# Patient Record
Sex: Male | Born: 1950
Health system: Southern US, Community
[De-identification: ages and names within clinical notes are randomized; demographics above are authoritative.]

## PROBLEM LIST (undated history)

## (undated) DIAGNOSIS — I10 Essential (primary) hypertension: Secondary | ICD-10-CM

## (undated) DIAGNOSIS — E119 Type 2 diabetes mellitus without complications: Secondary | ICD-10-CM

## (undated) DIAGNOSIS — E785 Hyperlipidemia, unspecified: Secondary | ICD-10-CM

## (undated) HISTORY — DX: Essential (primary) hypertension: I10

## (undated) HISTORY — PX: KNEE SURGERY: SHX244

## (undated) HISTORY — DX: Type 2 diabetes mellitus without complications: E11.9

## (undated) HISTORY — PX: FOOT SURGERY: SHX648

---

## 2015-07-02 DIAGNOSIS — N529 Male erectile dysfunction, unspecified: Secondary | ICD-10-CM | POA: Diagnosis not present

## 2015-07-02 DIAGNOSIS — I10 Essential (primary) hypertension: Secondary | ICD-10-CM | POA: Diagnosis not present

## 2015-07-02 DIAGNOSIS — B189 Chronic viral hepatitis, unspecified: Secondary | ICD-10-CM | POA: Diagnosis not present

## 2015-07-02 MED FILL — AMLODIPINE BESYLATE 10 MG T: 10 | 30 days supply | Qty: 30 | Fill #0

## 2015-07-02 MED FILL — LISINOPRIL 40 MG TABLET: 40 | 30 days supply | Qty: 30 | Fill #0

## 2015-07-02 MED FILL — ATORVASTATIN 20 MG TABLET: 20 | 30 days supply | Qty: 30 | Fill #0

## 2015-07-02 MED FILL — HYDROCHLOROTHIAZIDE 25 MG T: 25 | 30 days supply | Qty: 30 | Fill #0

## 2015-07-05 MED FILL — metFORMIN HCL 1000 MG TABS: 1000 | 30 days supply | Qty: 60 | Fill #0

## 2015-07-16 DIAGNOSIS — E119 Type 2 diabetes mellitus without complications: Secondary | ICD-10-CM | POA: Diagnosis not present

## 2015-07-16 DIAGNOSIS — I10 Essential (primary) hypertension: Secondary | ICD-10-CM | POA: Diagnosis not present

## 2015-07-22 MED FILL — TRUEplus LANCETS 30G MISC: 90 days supply | Qty: 100 | Fill #0

## 2015-07-22 MED FILL — TRUE METRIX GLUCOSE TEST ST: 90 days supply | Qty: 100 | Fill #0

## 2015-07-26 MED FILL — ATORVASTATIN 20 MG TABLET: 20 | 30 days supply | Qty: 30 | Fill #1

## 2015-08-02 MED FILL — AMLODIPINE BESYLATE 10 MG T: 10 | 30 days supply | Qty: 30 | Fill #1

## 2015-08-02 MED FILL — HYDROCHLOROTHIAZIDE 25 MG T: 25 | 30 days supply | Qty: 30 | Fill #1

## 2015-08-04 MED FILL — metFORMIN HCL 1000 MG TABS: 1000 | 30 days supply | Qty: 60 | Fill #1

## 2015-08-11 DIAGNOSIS — E119 Type 2 diabetes mellitus without complications: Secondary | ICD-10-CM | POA: Diagnosis not present

## 2015-08-24 MED FILL — TRUEplus LANCETS 30G MISC: 50 days supply | Qty: 100 | Fill #0

## 2015-08-24 MED FILL — ATORVASTATIN 20 MG TABLET: 20 | 30 days supply | Qty: 30 | Fill #2

## 2015-08-24 MED FILL — TRUE METRIX GLUCOSE TEST ST: 50 days supply | Qty: 100 | Fill #0

## 2015-08-30 MED FILL — AMLODIPINE BESYLATE 10 MG T: 10 | 30 days supply | Qty: 30 | Fill #2

## 2015-08-30 MED FILL — HYDROCHLOROTHIAZIDE 25 MG T: 25 | 30 days supply | Qty: 30 | Fill #2

## 2015-09-01 DIAGNOSIS — E119 Type 2 diabetes mellitus without complications: Secondary | ICD-10-CM | POA: Diagnosis not present

## 2015-09-20 DIAGNOSIS — H401134 Primary open-angle glaucoma, bilateral, indeterminate stage: Secondary | ICD-10-CM | POA: Diagnosis not present

## 2015-09-20 MED FILL — LISINOPRIL 40 MG TABLET: 40 | 30 days supply | Qty: 30 | Fill #1

## 2015-09-23 DIAGNOSIS — H10023 Other mucopurulent conjunctivitis, bilateral: Secondary | ICD-10-CM | POA: Diagnosis not present

## 2015-09-24 MED FILL — TOBRAMYCIN 0.3% EYE DROPS: 0.3 | 12 days supply | Qty: 5 | Fill #0

## 2015-09-27 MED FILL — AMLODIPINE BESYLATE 10 MG T: 10 | 30 days supply | Qty: 30 | Fill #3

## 2015-09-27 MED FILL — metFORMIN HCL 1000 MG TABS: 1000 | 30 days supply | Qty: 60 | Fill #2

## 2015-09-27 MED FILL — ATORVASTATIN 20 MG TABLET: 20 | 30 days supply | Qty: 30 | Fill #3

## 2015-10-15 MED FILL — HYDROCHLOROTHIAZIDE 25 MG T: 25 | 30 days supply | Qty: 30 | Fill #3

## 2015-11-09 MED FILL — AMLODIPINE BESYLATE 10 MG T: 10 | 30 days supply | Qty: 30 | Fill #4

## 2015-11-09 MED FILL — ATORVASTATIN 20 MG TABLET: 20 | 30 days supply | Qty: 30 | Fill #4

## 2015-11-09 MED FILL — LISINOPRIL 40 MG TABLET: 40 | 30 days supply | Qty: 30 | Fill #2

## 2015-11-22 MED FILL — HYDROCHLOROTHIAZIDE 25 MG T: 25 | 30 days supply | Qty: 30 | Fill #4

## 2015-12-14 MED FILL — LISINOPRIL 40 MG TABLET: 40 | 30 days supply | Qty: 30 | Fill #3

## 2015-12-14 MED FILL — AMLODIPINE BESYLATE 10 MG T: 10 | 30 days supply | Qty: 30 | Fill #5

## 2015-12-14 MED FILL — ATORVASTATIN 20 MG TABLET: 20 | 30 days supply | Qty: 30 | Fill #5

## 2015-12-20 DIAGNOSIS — E7800 Pure hypercholesterolemia, unspecified: Secondary | ICD-10-CM | POA: Diagnosis not present

## 2015-12-20 DIAGNOSIS — E119 Type 2 diabetes mellitus without complications: Secondary | ICD-10-CM | POA: Diagnosis not present

## 2015-12-20 DIAGNOSIS — R413 Other amnesia: Secondary | ICD-10-CM | POA: Diagnosis not present

## 2015-12-20 DIAGNOSIS — I10 Essential (primary) hypertension: Secondary | ICD-10-CM | POA: Diagnosis not present

## 2015-12-20 MED FILL — VIAGRA 100 MG TABLET: 100 | 30 days supply | Qty: 6 | Fill #0

## 2015-12-20 MED FILL — metFORMIN HCL 1000 MG TABS: 1000 | 30 days supply | Qty: 60 | Fill #0

## 2015-12-20 MED FILL — TRUEplus LANCETS 30G MISC: 50 days supply | Qty: 100 | Fill #0

## 2015-12-20 MED FILL — HYDROCHLOROTHIAZIDE 25 MG T: 25 | 30 days supply | Qty: 30 | Fill #0

## 2015-12-20 MED FILL — TRUE METRIX GLUCOSE TEST ST: 50 days supply | Qty: 100 | Fill #0

## 2015-12-30 DIAGNOSIS — Z23 Encounter for immunization: Secondary | ICD-10-CM | POA: Diagnosis not present

## 2016-01-17 MED FILL — LISINOPRIL 40 MG TABLET: 40 | 30 days supply | Qty: 30 | Fill #4

## 2016-01-17 MED FILL — ATORVASTATIN 20 MG TABLET: 20 | 90 days supply | Qty: 90 | Fill #0

## 2016-01-17 MED FILL — AMLODIPINE BESYLATE 10 MG T: 10 | 90 days supply | Qty: 90 | Fill #0

## 2016-02-15 MED FILL — HYDROCHLOROTHIAZIDE 25 MG T: 25 | 90 days supply | Qty: 90 | Fill #1

## 2016-02-16 MED FILL — LISINOPRIL 40 MG TABLET: 40 | 30 days supply | Qty: 30 | Fill #5

## 2016-02-16 MED FILL — metFORMIN HCL 1000 MG TABS: 1000 | 30 days supply | Qty: 60 | Fill #1

## 2016-03-22 MED FILL — TRUE METRIX GLUCOSE TEST ST: 50 days supply | Qty: 100 | Fill #1

## 2016-03-27 MED FILL — LISINOPRIL 40 MG TABLET: 40 | 30 days supply | Qty: 30 | Fill #0

## 2016-04-17 DIAGNOSIS — E119 Type 2 diabetes mellitus without complications: Secondary | ICD-10-CM | POA: Diagnosis not present

## 2016-04-17 DIAGNOSIS — I10 Essential (primary) hypertension: Secondary | ICD-10-CM | POA: Diagnosis not present

## 2016-04-18 MED FILL — metFORMIN HCL 1000 MG TABS: 1000 | 30 days supply | Qty: 60 | Fill #2

## 2016-04-18 MED FILL — SILDENAFIL 100 MG TABLET: 100 | 30 days supply | Qty: 6 | Fill #1

## 2016-05-12 MED FILL — SILDENAFIL 100 MG TABLET: 100 | 30 days supply | Qty: 6 | Fill #2

## 2016-05-16 MED FILL — LISINOPRIL 40 MG TABLET: 40 | 30 days supply | Qty: 30 | Fill #1

## 2016-05-16 MED FILL — ATORVASTATIN 20 MG TABLET: 20 | 90 days supply | Qty: 90 | Fill #1

## 2016-05-16 MED FILL — HYDROCHLOROTHIAZIDE 25 MG T: 25 | 60 days supply | Qty: 60 | Fill #2

## 2016-05-16 MED FILL — AMLODIPINE BESYLATE 10 MG T: 10 | 90 days supply | Qty: 90 | Fill #1

## 2016-06-13 MED FILL — LISINOPRIL 40 MG TABLET: 40 | 90 days supply | Qty: 90 | Fill #2

## 2016-06-21 MED FILL — FREESTYLE LANCETS: 90 days supply | Qty: 100 | Fill #0

## 2016-06-21 MED FILL — FREESTYLE LITE TEST STRIP: 90 days supply | Qty: 100 | Fill #0

## 2016-06-21 MED FILL — FREESTYLE LITE METER: 1 days supply | Qty: 1 | Fill #0

## 2016-06-21 MED FILL — metFORMIN HCL 1000 MG TABS: 1000 | 30 days supply | Qty: 60 | Fill #0

## 2016-07-25 DIAGNOSIS — E119 Type 2 diabetes mellitus without complications: Secondary | ICD-10-CM | POA: Diagnosis not present

## 2016-07-25 DIAGNOSIS — I10 Essential (primary) hypertension: Secondary | ICD-10-CM | POA: Diagnosis not present

## 2016-07-25 DIAGNOSIS — E7800 Pure hypercholesterolemia, unspecified: Secondary | ICD-10-CM | POA: Diagnosis not present

## 2016-08-22 MED FILL — HYDROCHLOROTHIAZIDE 25 MG T: 25 | 90 days supply | Qty: 90 | Fill #0

## 2016-08-22 MED FILL — metFORMIN HCL 1000 MG TABS: 1000 | 30 days supply | Qty: 60 | Fill #1

## 2016-09-04 MED FILL — ATORVASTATIN 20 MG TABLET: 20 | 90 days supply | Qty: 90 | Fill #0

## 2016-09-04 MED FILL — AMLODIPINE BESYLATE 10 MG T: 10 | 90 days supply | Qty: 90 | Fill #0

## 2016-09-08 MED FILL — SILDENAFIL 100 MG TABLET: 100 | 30 days supply | Qty: 6 | Fill #3

## 2016-10-09 MED FILL — LISINOPRIL 40 MG TAB: 40 | 30 days supply | Qty: 30 | Fill #3

## 2016-10-09 MED FILL — SILDENAFIL CITRATE 100 MG T: 100 | 30 days supply | Qty: 6 | Fill #4

## 2016-10-12 MED FILL — FREESTYLE LITE TEST STRIP: 90 days supply | Qty: 100 | Fill #1

## 2016-11-07 MED FILL — metFORMIN HCL 1000 MG TABS: 1000 | 30 days supply | Qty: 60 | Fill #2

## 2016-11-09 MED FILL — LISINOPRIL 40 MG TAB: 40 | 30 days supply | Qty: 30 | Fill #0

## 2016-11-16 DIAGNOSIS — R413 Other amnesia: Secondary | ICD-10-CM | POA: Diagnosis not present

## 2016-11-16 DIAGNOSIS — I10 Essential (primary) hypertension: Secondary | ICD-10-CM | POA: Diagnosis not present

## 2016-11-16 DIAGNOSIS — E7800 Pure hypercholesterolemia, unspecified: Secondary | ICD-10-CM | POA: Diagnosis not present

## 2016-11-16 DIAGNOSIS — E119 Type 2 diabetes mellitus without complications: Secondary | ICD-10-CM | POA: Diagnosis not present

## 2016-12-05 MED FILL — SILDENAFIL CITRATE 100 MG T: 100 | 30 days supply | Qty: 6 | Fill #5

## 2016-12-05 MED FILL — HYDROCHLOROTHIAZIDE 25 MG T: 25 | 90 days supply | Qty: 90 | Fill #1

## 2016-12-22 MED FILL — ATORVASTATIN 20 MG TABLET: 20 | 90 days supply | Qty: 90 | Fill #1

## 2016-12-22 MED FILL — AMLODIPINE BESYLATE 10 MG T: 10 | 90 days supply | Qty: 90 | Fill #1

## 2016-12-25 MED FILL — metFORMIN HCL 1000 MG TABS: 1000 | 30 days supply | Qty: 60 | Fill #0

## 2017-01-10 MED FILL — LISINOPRIL 40 MG TABLET: 40 | 30 days supply | Qty: 30 | Fill #1

## 2017-01-10 MED FILL — SILDENAFIL CITRATE 100 MG T: 100 | 30 days supply | Qty: 6 | Fill #0 | Status: TO

## 2017-01-16 DIAGNOSIS — H524 Presbyopia: Secondary | ICD-10-CM | POA: Diagnosis not present

## 2017-01-16 DIAGNOSIS — H40023 Open angle with borderline findings, high risk, bilateral: Secondary | ICD-10-CM | POA: Diagnosis not present

## 2017-01-16 DIAGNOSIS — H52213 Irregular astigmatism, bilateral: Secondary | ICD-10-CM | POA: Diagnosis not present

## 2017-01-16 DIAGNOSIS — H348322 Tributary (branch) retinal vein occlusion, left eye, stable: Secondary | ICD-10-CM | POA: Diagnosis not present

## 2017-01-16 DIAGNOSIS — E119 Type 2 diabetes mellitus without complications: Secondary | ICD-10-CM | POA: Diagnosis not present

## 2017-01-16 DIAGNOSIS — H5212 Myopia, left eye: Secondary | ICD-10-CM | POA: Diagnosis not present

## 2017-01-30 DIAGNOSIS — H401133 Primary open-angle glaucoma, bilateral, severe stage: Secondary | ICD-10-CM | POA: Diagnosis not present

## 2017-01-30 DIAGNOSIS — H348322 Tributary (branch) retinal vein occlusion, left eye, stable: Secondary | ICD-10-CM | POA: Diagnosis not present

## 2017-01-30 DIAGNOSIS — H3562 Retinal hemorrhage, left eye: Secondary | ICD-10-CM | POA: Diagnosis not present

## 2017-01-30 DIAGNOSIS — H2513 Age-related nuclear cataract, bilateral: Secondary | ICD-10-CM | POA: Diagnosis not present

## 2017-01-30 MED FILL — LATANOPROST 0.005% EYE DRP: 0.005 | 25 days supply | Qty: 3 | Fill #0

## 2017-02-02 DIAGNOSIS — H401123 Primary open-angle glaucoma, left eye, severe stage: Secondary | ICD-10-CM | POA: Diagnosis not present

## 2017-02-02 DIAGNOSIS — H25813 Combined forms of age-related cataract, bilateral: Secondary | ICD-10-CM | POA: Diagnosis not present

## 2017-02-02 DIAGNOSIS — E119 Type 2 diabetes mellitus without complications: Secondary | ICD-10-CM | POA: Diagnosis not present

## 2017-02-02 DIAGNOSIS — H401111 Primary open-angle glaucoma, right eye, mild stage: Secondary | ICD-10-CM | POA: Diagnosis not present

## 2017-02-02 MED FILL — DORZOLAMIDE-TIMOLOL EYE DRP: 22.3-6.8 | 90 days supply | Qty: 10 | Fill #0

## 2017-02-07 MED FILL — SILDENAFIL CITRATE 100 MG T: 100 | 30 days supply | Qty: 6 | Fill #1 | Status: TO

## 2017-02-07 MED FILL — LISINOPRIL 40 MG TABLET: 40 | 30 days supply | Qty: 30 | Fill #2

## 2017-02-16 MED FILL — FREESTYLE LITE TEST STRIP: 90 days supply | Qty: 100 | Fill #2

## 2017-03-05 MED FILL — SILDENAFIL CITRATE 100 MG T: 100 | 30 days supply | Qty: 6 | Fill #2 | Status: TO

## 2017-03-06 MED FILL — SILDENAFIL CITRATE 100 MG T: 100 | 30 days supply | Qty: 6 | Fill #3 | Status: TO

## 2017-03-23 MED FILL — metFORMIN HCL 1000 MG TABS: 1000 | 30 days supply | Qty: 60 | Fill #1

## 2017-03-23 MED FILL — LISINOPRIL 40 MG TABLET: 40 | 30 days supply | Qty: 30 | Fill #3

## 2017-03-26 DIAGNOSIS — H401123 Primary open-angle glaucoma, left eye, severe stage: Secondary | ICD-10-CM | POA: Diagnosis not present

## 2017-03-26 DIAGNOSIS — H25813 Combined forms of age-related cataract, bilateral: Secondary | ICD-10-CM | POA: Diagnosis not present

## 2017-03-26 DIAGNOSIS — H401111 Primary open-angle glaucoma, right eye, mild stage: Secondary | ICD-10-CM | POA: Diagnosis not present

## 2017-03-30 ENCOUNTER — Ambulatory Visit: Payer: No Typology Code available for payment source | Admitting: Family Medicine

## 2017-04-06 ENCOUNTER — Ambulatory Visit: Payer: No Typology Code available for payment source | Admitting: Family Medicine

## 2017-04-18 MED FILL — SILDENAFIL CITRATE 100 MG T: 100 | 30 days supply | Qty: 6 | Fill #4 | Status: TO

## 2017-05-11 ENCOUNTER — Ambulatory Visit: Payer: No Typology Code available for payment source | Admitting: Family Medicine

## 2017-05-17 ENCOUNTER — Encounter: Payer: Self-pay | Admitting: Family Medicine

## 2017-05-17 ENCOUNTER — Ambulatory Visit (INDEPENDENT_AMBULATORY_CARE_PROVIDER_SITE_OTHER): Payer: No Typology Code available for payment source | Admitting: Family Medicine

## 2017-05-17 VITALS — BP 136/84 | HR 83 | Temp 98.2°F | Ht 71.0 in | Wt 264.6 lb

## 2017-05-17 DIAGNOSIS — I10 Essential (primary) hypertension: Secondary | ICD-10-CM

## 2017-05-17 DIAGNOSIS — H409 Unspecified glaucoma: Secondary | ICD-10-CM | POA: Diagnosis not present

## 2017-05-17 DIAGNOSIS — E119 Type 2 diabetes mellitus without complications: Secondary | ICD-10-CM | POA: Insufficient documentation

## 2017-05-17 DIAGNOSIS — I152 Hypertension secondary to endocrine disorders: Secondary | ICD-10-CM

## 2017-05-17 DIAGNOSIS — Z23 Encounter for immunization: Secondary | ICD-10-CM | POA: Diagnosis not present

## 2017-05-17 DIAGNOSIS — E785 Hyperlipidemia, unspecified: Secondary | ICD-10-CM

## 2017-05-17 DIAGNOSIS — E1159 Type 2 diabetes mellitus with other circulatory complications: Secondary | ICD-10-CM | POA: Diagnosis not present

## 2017-05-17 DIAGNOSIS — N529 Male erectile dysfunction, unspecified: Secondary | ICD-10-CM

## 2017-05-17 DIAGNOSIS — E1169 Type 2 diabetes mellitus with other specified complication: Secondary | ICD-10-CM | POA: Diagnosis not present

## 2017-05-17 DIAGNOSIS — E1139 Type 2 diabetes mellitus with other diabetic ophthalmic complication: Secondary | ICD-10-CM | POA: Diagnosis not present

## 2017-05-17 LAB — POCT GLYCOSYLATED HEMOGLOBIN (HGB A1C): Hemoglobin A1C: 6.7

## 2017-05-17 MED ORDER — ATORVASTATIN CALCIUM 20 MG PO TABS: 20.0000 mg | ORAL_TABLET | Freq: Every day | ORAL | 3 refills | Status: DC

## 2017-05-17 MED ORDER — HYDROCHLOROTHIAZIDE 25 MG PO TABS: 25.0000 mg | ORAL_TABLET | Freq: Every day | ORAL | 3 refills | Status: DC

## 2017-05-17 MED ORDER — METFORMIN HCL ER 500 MG PO TB24: 1000.0000 mg | ORAL_TABLET | Freq: Every day | ORAL | 3 refills | Status: DC

## 2017-05-17 MED ORDER — AMLODIPINE BESYLATE 10 MG PO TABS: 10.0000 mg | ORAL_TABLET | Freq: Every day | ORAL | 3 refills | Status: DC

## 2017-05-17 MED ORDER — SILDENAFIL CITRATE 100 MG PO TABS: 100.0000 mg | ORAL_TABLET | Freq: Every day | ORAL | 3 refills | Status: DC | PRN

## 2017-05-17 MED FILL — HYDROCHLOROTHIAZIDE 25 MG T: 25 | 90 days supply | Qty: 90 | Fill #0

## 2017-05-17 MED FILL — AMLODIPINE BESYLATE 10 MG T: 10 | 90 days supply | Qty: 90 | Fill #0

## 2017-05-17 MED FILL — ATORVASTATIN 20 MG TABLET: 20 | 90 days supply | Qty: 90 | Fill #0

## 2017-05-17 MED FILL — METFORMIN HCL ER 500 MG TAB: 500 | 90 days supply | Qty: 180 | Fill #0

## 2017-05-17 MED FILL — SILDENAFIL CITRATE 100 MG T: 100 | 30 days supply | Qty: 6 | Fill #5 | Status: TO

## 2017-05-17 NOTE — Assessment & Plan Note (Signed)
Continue Lipitor 20 mg daily.  Obtain records from previous PCP.  Will need lipid panel if not done within the last year.

## 2017-05-17 NOTE — Assessment & Plan Note (Signed)
Stable.  Referral to ophthalmology placed.

## 2017-05-17 NOTE — Assessment & Plan Note (Signed)
At goal today.  Continue amlodipine, HCTZ, and lisinopril.  Obtain records from previous PCP.  Follow-up in 6 months.

## 2017-05-17 NOTE — Progress Notes (Signed)
Subjective:  Charles Reid is a 67 y.o. male who presents today with a chief complaint of T2DM and to establish care.   HPI:  Type 2 diabetes, new problem Several year history.  Currently on metformin 1000 mg daily.  While this medication.  Occasionally has diarrhea.  Checks his blood sugar routinely.  Typically in the low 100s.  No polyuria or polydipsia.  No hypoglycemic symptoms.  Reports A1c of 7.2 about 6 months ago.  Hypertension, new problem Several year history.  Current medications include Norvasc 10 mg daily, HCTZ 25 mg daily, and lisinopril 40 mg daily.  Tolerates all his medications well without side effects.  No chest pain or shortness of breath.  No lower extremity swelling.  Hyperlipidemia, new problem Currently on atorvastatin 20 mg daily.  He tolerates this medication well without side effects.  No myalgias.  Erectile dysfunction, new problem Several year history.  Uses sildenafil 100 mg tablets as needed.  No priapism.  No dizziness or lightheadedness.  Glaucoma, new problem Several year history.  Sees ophthalmology.  Needs a referral to see them soon.  Vision is stable.  No eye pain.  ROS: Per HPI, otherwise a complete review of systems was negative.   PMH:  The following were reviewed and entered/updated in epic: Past Medical History:  Diagnosis Date  . Diabetes mellitus without complication (HCC)   . Hypertension    Patient Active Problem List   Diagnosis Date Noted  . Hypertension associated with diabetes (HCC) 05/17/2017  . Diabetes mellitus (HCC) 05/17/2017  . Hyperlipidemia associated with type 2 diabetes mellitus (HCC) 05/17/2017  . Glaucoma 05/17/2017  . Erectile dysfunction 05/17/2017   Past Surgical History:  Procedure Laterality Date  . FOOT SURGERY    . KNEE SURGERY      FH of heart disease in parents.   Medications- reviewed and updated Current Outpatient Medications  Medication Sig Dispense Refill  . amLODipine (NORVASC) 10 MG  tablet Take 1 tablet (10 mg total) by mouth daily. 90 tablet 3  . aspirin EC 81 MG tablet Take 81 mg by mouth daily.    Marland Kitchen atorvastatin (LIPITOR) 20 MG tablet Take 1 tablet (20 mg total) by mouth daily. 90 tablet 3  . b complex vitamins tablet Take 1 tablet by mouth daily.    . dorzolamide-timolol (COSOPT) 22.3-6.8 MG/ML ophthalmic solution Apply to eye.    Marland Kitchen FREESTYLE LITE test strip USE ONCE A DAY AS DIRECTED  5  . hydrochlorothiazide (HYDRODIURIL) 25 MG tablet Take 1 tablet (25 mg total) by mouth daily. 90 tablet 3  . latanoprost (XALATAN) 0.005 % ophthalmic solution Apply to eye.    Marland Kitchen lisinopril (PRINIVIL,ZESTRIL) 40 MG tablet Take 40 mg by mouth daily.    . Multiple Vitamin (MULTI-VITAMINS) TABS Take by mouth.    . sildenafil (VIAGRA) 100 MG tablet Take 1 tablet (100 mg total) by mouth daily as needed for erectile dysfunction. 30 tablet 3  . metFORMIN (GLUCOPHAGE XR) 500 MG 24 hr tablet Take 2 tablets (1,000 mg total) by mouth daily with breakfast. 180 tablet 3   No current facility-administered medications for this visit.    Allergies-reviewed and updated No Known Allergies  Social History   Socioeconomic History  . Marital status: Unknown    Spouse name: None  . Number of children: None  . Years of education: None  . Highest education level: None  Social Needs  . Financial resource strain: None  . Food insecurity - worry: None  .  Food insecurity - inability: None  . Transportation needs - medical: None  . Transportation needs - non-medical: None  Occupational History  . None  Tobacco Use  . Smoking status: Never Smoker  . Smokeless tobacco: Never Used  Substance and Sexual Activity  . Alcohol use: Yes    Comment: Occasionally  . Drug use: No  . Sexual activity: Yes  Other Topics Concern  . None  Social History Narrative  . None   Objective:  Physical Exam: BP 136/84 (BP Location: Left Arm, Patient Position: Sitting, Cuff Size: Large)   Pulse 83   Temp 98.2 F  (36.8 C) (Oral)   Ht 5\' 11"  (1.803 m)   Wt 264 lb 9.6 oz (120 kg)   SpO2 93%   BMI 36.90 kg/m   Gen: NAD, resting comfortably CV: RRR with no murmurs appreciated Pulm: NWOB, CTAB with no crackles, wheezes, or rhonchi GI: Normal bowel sounds present. Soft, Nontender, Nondistended. MSK: No edema, cyanosis, or clubbing noted Skin: Warm, dry Neuro: Grossly normal, moves all extremities Psych: Normal affect and thought content  Results for orders placed or performed in visit on 05/17/17 (from the past 24 hour(s))  POCT glycosylated hemoglobin (Hb A1C)     Status: None   Collection Time: 05/17/17 10:05 AM  Result Value Ref Range   Hemoglobin A1C 6.7      Assessment/Plan:  Diabetes mellitus (HCC) A1c improved to 6.7 today.  We will change his metformin to the extended release form to see if this helps him with his diarrhea.  Continue 1000 mg daily.  He will follow-up with me in 6 months.  We will obtain records from previous PCP regarding preventative healthcare.  Hypertension associated with diabetes (HCC) At goal today.  Continue amlodipine, HCTZ, and lisinopril.  Obtain records from previous PCP.  Follow-up in 6 months.  Hyperlipidemia associated with type 2 diabetes mellitus (HCC) Continue Lipitor 20 mg daily.  Obtain records from previous PCP.  Will need lipid panel if not done within the last year.  Glaucoma Stable.  Referral to ophthalmology placed.  Erectile dysfunction Stable.  Viagra refilled.  Preventative healthcare Flu shot given today.  Obtain records from previous PCP.  Katina Degree. Jimmey Konstantinos, MD 05/17/2017 10:24 AM

## 2017-05-17 NOTE — Patient Instructions (Signed)
It was very nice to meet you today.  Your A1c is 6.7 I will send in the extended release form of metformin. Please take 2 pills every morning.  I will refill your other meds.  I put in a referral to the eye doctor for you.  Come back to see me in 6 months, or sooner as needed.  Take care, Dr Jimmey Ramona

## 2017-05-17 NOTE — Assessment & Plan Note (Signed)
A1c improved to 6.7 today.  We will change his metformin to the extended release form to see if this helps him with his diarrhea.  Continue 1000 mg daily.  He will follow-up with me in 6 months.  We will obtain records from previous PCP regarding preventative healthcare.

## 2017-05-17 NOTE — Assessment & Plan Note (Signed)
Stable.  Viagra refilled. 

## 2017-05-23 ENCOUNTER — Encounter: Payer: Self-pay | Admitting: *Deleted

## 2017-05-23 ENCOUNTER — Ambulatory Visit: Payer: Self-pay | Admitting: *Deleted

## 2017-05-23 NOTE — Telephone Encounter (Signed)
This encounter was created in error - please disregard.

## 2017-05-23 NOTE — Telephone Encounter (Signed)
See note

## 2017-05-23 NOTE — Telephone Encounter (Signed)
Pt  Has  Been taking  Nyquil   And  Drinking lots  Of  Liquids    The  Cough  Is  Persistent   He  Reports  His  Wife  His  Similar  Symptoms.    The  Patient   Denies  Any  Shortness  Of  Breath   He  Reports some    Sinus  Congestion    As   Well   He  Agrees   To plan of  Care.        Reason for Disposition . [1] Continuous (nonstop) coughing interferes with work or school AND [2] no improvement using cough treatment per Care Advice  Answer Assessment - Initial Assessment Questions 1. ONSET: "When did the cough begin?"       Day  And  1/2  Ago   2. SEVERITY: "How bad is the cough today?"         Bad     Coughing  Very  Frequent    3. RESPIRATORY DISTRESS: "Describe your breathing."        Breathing  Ok   4. FEVER: "Do you have a fever?" If so, ask: "What is your temperature, how was it measured, and when did it start?"        99.3    Oral  Thermometer   1/2  Hour   ago 5. SPUTUM: "Describe the color of your sputum" (clear, white, yellow, green)     White   6. HEMOPTYSIS: "Are you coughing up any blood?" If so ask: "How much?" (flecks, streaks, tablespoons, etc.)     no 7. CARDIAC HISTORY: "Do you have any history of heart disease?" (e.g., heart attack, congestive heart failure)       no 8. LUNG HISTORY: "Do you have any history of lung disease?"  (e.g., pulmonary embolus, asthma, emphysema)     no 9. PE RISK FACTORS: "Do you have a history of blood clots?" (or: recent major surgery, recent prolonged travel, bedridden )       no 10. OTHER SYMPTOMS: "Do you have any other symptoms?" (e.g., runny nose, wheezing, chest pain)        Slightly  Runny   Head congested      11. PREGNANCY: "Is there any chance you are pregnant?" "When was your last menstrual period?"       n/a 12. TRAVEL: "Have you traveled out of the country in the last month?" (e.g., travel history, exposures)         Wife  Has  Similar  Symptoms  -  X 3  Days  Protocols used: COUGH - ACUTE PRODUCTIVE-A-AH

## 2017-05-24 ENCOUNTER — Encounter: Payer: Self-pay | Admitting: Family Medicine

## 2017-05-24 ENCOUNTER — Ambulatory Visit (INDEPENDENT_AMBULATORY_CARE_PROVIDER_SITE_OTHER): Payer: No Typology Code available for payment source | Admitting: Family Medicine

## 2017-05-24 VITALS — BP 146/86 | HR 101 | Temp 98.6°F | Ht 71.0 in | Wt 260.2 lb

## 2017-05-24 DIAGNOSIS — R509 Fever, unspecified: Secondary | ICD-10-CM | POA: Diagnosis not present

## 2017-05-24 DIAGNOSIS — J101 Influenza due to other identified influenza virus with other respiratory manifestations: Secondary | ICD-10-CM | POA: Diagnosis not present

## 2017-05-24 DIAGNOSIS — R52 Pain, unspecified: Secondary | ICD-10-CM | POA: Diagnosis not present

## 2017-05-24 DIAGNOSIS — R030 Elevated blood-pressure reading, without diagnosis of hypertension: Secondary | ICD-10-CM | POA: Diagnosis not present

## 2017-05-24 DIAGNOSIS — R059 Cough, unspecified: Secondary | ICD-10-CM

## 2017-05-24 DIAGNOSIS — R05 Cough: Secondary | ICD-10-CM

## 2017-05-24 LAB — POC INFLUENZA A&B (BINAX/QUICKVUE)
INFLUENZA A, POC: POSITIVE — AB
INFLUENZA B, POC: NEGATIVE

## 2017-05-24 MED ORDER — GUAIFENESIN-CODEINE 100-10 MG/5ML PO SOLN: 5.0000 mL | Freq: Three times a day (TID) | ORAL | 0 refills | Status: DC | PRN

## 2017-05-24 MED ORDER — OSELTAMIVIR PHOSPHATE 75 MG PO CAPS: 75.0000 mg | ORAL_CAPSULE | Freq: Two times a day (BID) | ORAL | 0 refills | Status: DC

## 2017-05-24 MED FILL — CHERATUSSIN AC SYRUP: 100-10 | 8 days supply | Qty: 120 | Fill #0

## 2017-05-24 MED FILL — OSELTAMIVIR PHOSPHATE 75 MG: 75 | 5 days supply | Qty: 10 | Fill #0

## 2017-05-24 NOTE — Telephone Encounter (Signed)
Noted  

## 2017-05-24 NOTE — Progress Notes (Signed)
    Subjective:  Charles Reid is a 67 y.o. male who presents today for same-day appointment with a chief complaint of Per HPI.   HPI:  Cough, Acute Issue Started 2 days ago.  Worsened over that time.  Associated with myalgias, fever to 99 F, malaise, and sinus congestion.  He has tried taking NyQuil which is not sniffily help.  Wife has had similar symptoms.  Symptoms are worse at night.  No other obvious bleeding or aggravating factors.  ROS: Per HPI  PMH: He reports that  has never smoked. he has never used smokeless tobacco. He reports that he drinks alcohol. He reports that he does not use drugs.  Objective:  Physical Exam: BP (!) 146/86 (BP Location: Left Arm, Patient Position: Sitting, Cuff Size: Normal)   Pulse (!) 101   Temp 98.6 F (37 C) (Oral)   Ht 5\' 11"  (1.803 m)   Wt 260 lb 3.2 oz (118 kg)   SpO2 94%   BMI 36.29 kg/m   Gen: NAD, resting comfortably HEENT: TMs clear bilaterally.  Oropharynx erythematous without exudate.  Nasal mucosa boggy and erythematous bilaterally. CV: RRR with no murmurs appreciated Pulm: NWOB, CTAB with no crackles, wheezes, or rhonchi  Results for orders placed or performed in visit on 05/24/17 (from the past 24 hour(s))  POC Influenza A&B(BINAX/QUICKVUE)     Status: Abnormal   Collection Time: 05/24/17  2:01 PM  Result Value Ref Range   Influenza A, POC Positive (A) Negative   Influenza B, POC Negative Negative     Assessment/Plan:  Cough/influenza A Rapid flu test positive.  Start Tamiflu 75 mg twice daily for 5 days.  Discussed side effects of this medication.  Also sending guaifenesin-codeine cough syrup as cough is keeping him up at night.  Encouraged good oral hydration.  Recommend Tylenol and/or Motrin as needed for pain and low-grade fever.  Return precautions reviewed.  Follow-up as needed.  Elevated blood pressure reading Slightly elevated daily setting of acute illness.  Continue home blood pressure monitoring.  No  indication to change medication regimen today.  Katina Degree. [name], MD 05/24/2017 2:08 PM

## 2017-06-11 MED FILL — LISINOPRIL 40 MG TABLET: 40 | 60 days supply | Qty: 60 | Fill #4

## 2017-06-11 MED FILL — SILDENAFIL CITRATE 100 MG T: 100 | 30 days supply | Qty: 6 | Fill #6 | Status: TO

## 2017-07-03 MED FILL — SILDENAFIL CITRATE 100 MG T: 100 | 60 days supply | Qty: 12 | Fill #7 | Status: TO

## 2017-08-01 ENCOUNTER — Ambulatory Visit: Payer: No Typology Code available for payment source | Admitting: Family Medicine

## 2017-08-13 MED FILL — SILDENAFIL CITRATE 100 MG T: 100 | 60 days supply | Qty: 12 | Fill #8 | Status: TO

## 2017-09-20 MED FILL — METFORMIN HCL ER 500 MG TAB: 500 | 90 days supply | Qty: 180 | Fill #1 | Status: TO

## 2017-09-20 MED FILL — SILDENAFIL CITRATE 100 MG T: 100 | 30 days supply | Qty: 6 | Fill #9 | Status: TO

## 2017-09-20 MED FILL — LISINOPRIL 40 MG TABLET: 40 | 90 days supply | Qty: 90 | Fill #0 | Status: TO

## 2017-10-26 MED FILL — SILDENAFIL CITRATE 100 MG T: 100 | 30 days supply | Qty: 6 | Fill #10 | Status: TO

## 2017-11-08 MED FILL — AMLODIPINE BESYLATE 10 MG T: 10 | 90 days supply | Qty: 90 | Fill #1 | Status: TO

## 2017-11-08 MED FILL — HYDROCHLOROTHIAZIDE 25 MG T: 25 | 90 days supply | Qty: 90 | Fill #1 | Status: TO

## 2017-11-08 MED FILL — ATORVASTATIN CALCIUM 20 MG: 20 | 90 days supply | Qty: 90 | Fill #1 | Status: TO

## 2017-11-14 ENCOUNTER — Ambulatory Visit: Payer: No Typology Code available for payment source | Admitting: Family Medicine

## 2017-11-14 DIAGNOSIS — Z0289 Encounter for other administrative examinations: Secondary | ICD-10-CM

## 2017-11-20 ENCOUNTER — Encounter: Payer: Self-pay | Admitting: Family Medicine

## 2017-11-20 MED FILL — SILDENAFIL CITRATE 100 MG T: 100 | 30 days supply | Qty: 6 | Fill #11 | Status: TO

## 2017-12-12 ENCOUNTER — Ambulatory Visit: Payer: No Typology Code available for payment source | Admitting: Family Medicine

## 2017-12-20 ENCOUNTER — Telehealth: Payer: Self-pay | Admitting: Family Medicine

## 2017-12-20 MED ORDER — SILDENAFIL CITRATE 100 MG PO TABS: 100.0000 mg | ORAL_TABLET | Freq: Every day | ORAL | 3 refills | Status: AC | PRN

## 2017-12-20 NOTE — Telephone Encounter (Signed)
See note

## 2017-12-20 NOTE — Telephone Encounter (Signed)
Freestyle Lite test strip refill Last Refilled by historical provider Last OV: 05/17/17 Next OV: 12/28/17 PCP: Dr. Jimmey Eilan [facility]

## 2017-12-20 NOTE — Telephone Encounter (Signed)
Copied from CRM [id number]. Topic: Quick Communication - Rx Refill/Question >> Dec 20, 2017  4:46 PM Marylen Ponto wrote: Medication: FREESTYLE LITE test strip  Has the patient contacted their pharmacy? yes   Preferred Pharmacy (with phone number or street name): [id number] [place], [place] - [address] [phone] (Phone) [phone] (Fax)  Agent: Please be advised that RX refills may take up to 3 business days. We ask that you follow-up with your pharmacy.

## 2017-12-20 NOTE — Telephone Encounter (Signed)
Copied from CRM [id number]. Topic: Quick Communication - Rx Refill/Question >> Dec 20, 2017  4:49 PM Mcneil, Ja-Kwan wrote: Medication: sildenafil (VIAGRA) 100 MG tablet  Has the patient contacted their pharmacy? yes   Preferred Pharmacy (with phone number or street name): [facility] [address] [place] - [address] [phone] (Phone) [phone] (Fax)  Agent: Please be advised that RX refills may take up to 3 business days. We ask that you follow-up with your pharmacy.

## 2017-12-21 ENCOUNTER — Other Ambulatory Visit: Payer: Self-pay

## 2017-12-21 MED ORDER — FREESTYLE LITE TEST VI STRP: ORAL_STRIP | 5 refills | Status: AC

## 2017-12-21 NOTE — Telephone Encounter (Signed)
Rx sent to pharmacy   

## 2017-12-28 ENCOUNTER — Telehealth: Payer: Self-pay | Admitting: Family Medicine

## 2017-12-28 ENCOUNTER — Ambulatory Visit: Payer: No Typology Code available for payment source | Admitting: Family Medicine

## 2017-12-28 NOTE — Telephone Encounter (Signed)
I called and spoke with the patient and explained that he does have another no show from back in August however, we can waive 1 of the our of courtesy he just has to call once he receives the statement as nothing at this time reflects in his account.   Patient states he understands that only 1 can be waived out of courtesy and agreed to call once he receives the statement.   Copied from CRM [id number]. Topic: Quick Communication - Appointment Cancellation >> Dec 28, 2017  9:13 AM Maia Petties wrote: Patient called to cancel appointment scheduled for today 3:00pm with Dr. Jimmey Upton. Patient has rescheduled their appointment.  Pt was advised of $50 no show fee. Pt stating he cannot get out of work and requesting fee be waived. Pt rescheduled for 01/11/18.  Route to department's PEC pool.

## 2018-01-11 ENCOUNTER — Encounter: Payer: Self-pay | Admitting: Family Medicine

## 2018-01-11 ENCOUNTER — Ambulatory Visit (INDEPENDENT_AMBULATORY_CARE_PROVIDER_SITE_OTHER): Payer: Medicare HMO | Admitting: Family Medicine

## 2018-01-11 VITALS — BP 148/80 | HR 105 | Temp 97.9°F | Wt 261.2 lb

## 2018-01-11 DIAGNOSIS — H409 Unspecified glaucoma: Secondary | ICD-10-CM

## 2018-01-11 DIAGNOSIS — E785 Hyperlipidemia, unspecified: Secondary | ICD-10-CM | POA: Diagnosis not present

## 2018-01-11 DIAGNOSIS — E1139 Type 2 diabetes mellitus with other diabetic ophthalmic complication: Secondary | ICD-10-CM | POA: Diagnosis not present

## 2018-01-11 DIAGNOSIS — I10 Essential (primary) hypertension: Secondary | ICD-10-CM | POA: Diagnosis not present

## 2018-01-11 DIAGNOSIS — E1159 Type 2 diabetes mellitus with other circulatory complications: Secondary | ICD-10-CM | POA: Diagnosis not present

## 2018-01-11 DIAGNOSIS — E1169 Type 2 diabetes mellitus with other specified complication: Secondary | ICD-10-CM

## 2018-01-11 DIAGNOSIS — Z23 Encounter for immunization: Secondary | ICD-10-CM

## 2018-01-11 DIAGNOSIS — I152 Hypertension secondary to endocrine disorders: Secondary | ICD-10-CM

## 2018-01-11 LAB — POCT GLYCOSYLATED HEMOGLOBIN (HGB A1C): HEMOGLOBIN A1C: 7.7 % — AB (ref 4.0–5.6)

## 2018-01-11 MED ORDER — METFORMIN HCL ER 500 MG PO TB24: 1000.0000 mg | ORAL_TABLET | Freq: Two times a day (BID) | ORAL | 3 refills | Status: AC

## 2018-01-11 NOTE — Assessment & Plan Note (Signed)
Slightly above goal today.  Home readings typically in the 130s over 80s.  Continue amlodipine 10 mg daily, HCTZ 25 mg daily, and lisinopril 40 mg daily.  Discussed home blood pressure monitoring with goal 140/90 or lower.  Follow-up with me in 3 to 6 months.

## 2018-01-11 NOTE — Progress Notes (Signed)
   Subjective:  Charles Reid is a 67 y.o. male who presents today with a chief complaint of T2DM.   HPI:  T2DM Last seen about a month ago for this.  That time A1c was 6.7.  He is currently on metformin 1000 mg daily.  Tolerating well.  No reported polyuria or polydipsia.  Hypertension Last seen about 8 months ago for this as well.  Currently on amlodipine 10 mg daily, HCTZ 25 mg daily, and lisinopril 40 mg daily.  Compliant with all these medications without reported side effects.  Hyperlipidemia Currently on atorvastatin 20 mg daily and tolerating well.  Glaucoma He has not yet established with an ophthalmologist here in the area.  No vision changes.  ROS: Per HPI  PMH: He reports that he has never smoked. He has never used smokeless tobacco. He reports that he drinks alcohol. He reports that he does not use drugs.  Objective:  Physical Exam: BP (!) 148/80 (BP Location: Left Arm, Patient Position: Sitting, Cuff Size: Large)   Pulse (!) 105   Temp 97.9 F (36.6 C) (Oral)   Wt 261 lb 3.2 oz (118.5 kg)   SpO2 98%   BMI 36.43 kg/m   Gen: NAD, resting comfortably CV: RRR with no murmurs appreciated Pulm: NWOB, CTAB with no crackles, wheezes, or rhonchi  Results for orders placed or performed in visit on 01/11/18 (from the past 24 hour(s))  POCT glycosylated hemoglobin (Hb A1C)     Status: Abnormal   Collection Time: 01/11/18  3:59 PM  Result Value Ref Range   Hemoglobin A1C 7.7 (A) 4.0 - 5.6 %   HbA1c POC (<> result, manual entry)     HbA1c, POC (prediabetic range)     HbA1c, POC (controlled diabetic range)       Assessment/Plan:  Diabetes mellitus (HCC) A1c increased to 7.7.  Increase metformin to 1000 mg twice daily.  Follow-up with me in 3 to 6 months.  Hypertension associated with diabetes (HCC) Slightly above goal today.  Home readings typically in the 130s over 80s.  Continue amlodipine 10 mg daily, HCTZ 25 mg daily, and lisinopril 40 mg daily.  Discussed home  blood pressure monitoring with goal 140/90 or lower.  Follow-up with me in 3 to 6 months.  Hyperlipidemia associated with type 2 diabetes mellitus (HCC) Stable.  Continue atorvastatin 20 mg daily.  Check lipid panel next blood draw.  Glaucoma Stable.  Placed referral to ophthalmology.  Preventive health care Flu shot given today  Charles Reid M. Charles Chao, MD 01/11/2018 5:34 PM

## 2018-01-11 NOTE — Assessment & Plan Note (Signed)
Stable.  Placed referral to ophthalmology.

## 2018-01-11 NOTE — Patient Instructions (Addendum)
It was very nice to see you today!  Please increase your metformin to 1000mg  twice daily.  I will refer you to ophthalmology as well.   No other changes today.  Come back in 3-6 months, or sooner as needed.   Take care, Dr Jimmey Daylan

## 2018-01-11 NOTE — Assessment & Plan Note (Signed)
Stable.  Continue atorvastatin 20 mg daily.  Check lipid panel next blood draw. 

## 2018-01-11 NOTE — Assessment & Plan Note (Signed)
A1c increased to 7.7.  Increase metformin to 1000 mg twice daily.  Follow-up with me in 3 to 6 months.

## 2018-04-04 ENCOUNTER — Other Ambulatory Visit: Payer: Self-pay | Admitting: Family Medicine

## 2018-06-29 ENCOUNTER — Other Ambulatory Visit: Payer: Self-pay | Admitting: Family Medicine

## 2018-07-02 ENCOUNTER — Ambulatory Visit (INDEPENDENT_AMBULATORY_CARE_PROVIDER_SITE_OTHER): Payer: Medicare HMO | Admitting: Family Medicine

## 2018-07-02 ENCOUNTER — Encounter: Payer: Self-pay | Admitting: Family Medicine

## 2018-07-02 VITALS — BP 120/80 | Temp 97.7°F

## 2018-07-02 DIAGNOSIS — E1139 Type 2 diabetes mellitus with other diabetic ophthalmic complication: Secondary | ICD-10-CM

## 2018-07-02 DIAGNOSIS — I10 Essential (primary) hypertension: Secondary | ICD-10-CM

## 2018-07-02 DIAGNOSIS — I152 Hypertension secondary to endocrine disorders: Secondary | ICD-10-CM

## 2018-07-02 DIAGNOSIS — E1159 Type 2 diabetes mellitus with other circulatory complications: Secondary | ICD-10-CM | POA: Diagnosis not present

## 2018-07-02 DIAGNOSIS — M25511 Pain in right shoulder: Secondary | ICD-10-CM | POA: Diagnosis not present

## 2018-07-02 MED ORDER — LISINOPRIL 40 MG PO TABS: 40.0000 mg | ORAL_TABLET | Freq: Every day | ORAL | 3 refills | Status: AC

## 2018-07-02 MED ORDER — DICLOFENAC SODIUM 75 MG PO TBEC: 75.0000 mg | DELAYED_RELEASE_TABLET | Freq: Two times a day (BID) | ORAL | 0 refills | Status: DC

## 2018-07-02 NOTE — Assessment & Plan Note (Signed)
Doing well.  Continue amlodipine 10 mg daily, HCTZ 25 mg daily, and lisinopril 40 mg daily.

## 2018-07-02 NOTE — Progress Notes (Signed)
    Chief Complaint:  Sumner Maille is a 68 y.o. male who presents today for a virtual office visit with a chief complaint of shoulder pain.   Assessment/Plan:  Shoulder pain Concern for possible rotator cuff injury given mechanism of fall.  He is able to have some range of motion however with pain.  Will start course of diclofenac.  Start home exercise program.  Refer to sports medicine orthopedics if not improving.  Diabetes mellitus (HCC) Continue metformin 1000 mg twice daily.  Unable to check A1c today due to COVID-19 pandemic.  Follow-up in 3 to 6 months to repeat A1c.  Hypertension associated with diabetes (HCC) Doing well.  Continue amlodipine 10 mg daily, HCTZ 25 mg daily, and lisinopril 40 mg daily.     Subjective:  HPI:  Shoulder Pain Started about a month ago.  Patient states that he slipped on ice and fell directly onto his right shoulder.  Immediately had some pain and swelling to the area.  Symptoms improved a little bit however still has quite a bit of pain to the area.  Pain is worse with certain movements.  Worse with range of motion.  Worse with laying on that side.  No weakness or numbness.  No specific treatments tried.  No other obvious alleviating or aggravating factors.  His stable, chronic medical conditions are outlined below:   # T2DM - On metformin 1000mg  twice daily and tolerating well. - ROS: No reported polyuria or polydipsia.  # Essential Hypertension - On amlodipine 10mg  daily HCTZ 25 mg daily, and lisinopril 40mg  daily.  Tolerating well. -Home blood pressures 120s to 130s over 80s. -ROS: No reported chest pain or shortness of breath  # Dyslipidemia - On atorvastatin 20mg  daily.  Tolerating well - ROS: No reported myalgias.  ROS: Per HPI  PMH: He reports that he has never smoked. He has never used smokeless tobacco. He reports current alcohol use. He reports that he does not use drugs.      Objective/Observations  Physical Exam: Gen: NAD,  resting comfortably Pulm: Normal work of breathing Neuro: Grossly normal, moves all extremities MSK: Right shoulder with no gross deformities.  Limited range of motion secondary to pain.  Tender to palpation along the lateral aspect.  Able to extend above head however has significant amount of pain with this. Psych: Normal affect and thought content  Virtual Visit via Video   I connected with Jenne Pane on 07/02/18 at 10:00 AM EDT by a video enabled telemedicine application and verified that I am speaking with the correct person using two identifiers. I discussed the limitations of evaluation and management by telemedicine and the availability of in person appointments. The patient expressed understanding and agreed to proceed.   Patient location: Home Provider location: [facility] Persons participating in the virtual visit: Myself and patient     Katina Degree. Jimmey Hadyn, MD 07/02/2018 10:23 AM

## 2018-07-02 NOTE — Assessment & Plan Note (Addendum)
Continue metformin 1000 mg twice daily.  Unable to check A1c today due to COVID-19 pandemic.  Follow-up in 3 to 6 months to repeat A1c.

## 2018-08-22 ENCOUNTER — Other Ambulatory Visit: Payer: Self-pay | Admitting: Family Medicine

## 2018-08-22 ENCOUNTER — Telehealth: Payer: Self-pay

## 2018-08-22 ENCOUNTER — Other Ambulatory Visit: Payer: Self-pay

## 2018-08-22 DIAGNOSIS — M25511 Pain in right shoulder: Secondary | ICD-10-CM

## 2018-08-22 NOTE — Telephone Encounter (Signed)
Referral has been placed. 

## 2018-08-22 NOTE — Telephone Encounter (Signed)
Copied from CRM [id number]. Topic: Referral - Request for Referral >> Aug 21, 2018  9:21 AM Crist Infante wrote: Pt states he is still having shoulder pain and unable to sleep at night.  Also in a lot of pain all the time.  Dr Jimmey Mukhtar had said if not better, will refer to sports med ortho. Pt would like to proceed since e is not better.

## 2018-08-22 NOTE — Telephone Encounter (Signed)
Ok to place referral.

## 2018-08-22 NOTE — Telephone Encounter (Signed)
Ok with referral.  Charles Reid. Charles Truman, MD 08/22/2018 2:05 PM

## 2018-09-02 DIAGNOSIS — M25511 Pain in right shoulder: Secondary | ICD-10-CM | POA: Diagnosis not present

## 2018-09-02 DIAGNOSIS — M7541 Impingement syndrome of right shoulder: Secondary | ICD-10-CM | POA: Diagnosis not present

## 2018-09-19 ENCOUNTER — Encounter: Payer: Self-pay | Admitting: Physical Therapy

## 2018-09-26 ENCOUNTER — Other Ambulatory Visit: Payer: Self-pay

## 2018-09-26 MED ORDER — ONETOUCH ULTRA 2 W/DEVICE KIT: PACK | 0 refills | Status: AC

## 2018-10-03 ENCOUNTER — Other Ambulatory Visit: Payer: Self-pay | Admitting: Family Medicine

## 2018-10-07 ENCOUNTER — Other Ambulatory Visit: Payer: Self-pay

## 2018-10-07 ENCOUNTER — Ambulatory Visit (INDEPENDENT_AMBULATORY_CARE_PROVIDER_SITE_OTHER): Payer: Medicare HMO | Admitting: Family Medicine

## 2018-10-07 ENCOUNTER — Encounter: Payer: Self-pay | Admitting: Family Medicine

## 2018-10-07 VITALS — BP 134/84 | HR 94 | Temp 98.4°F | Ht 70.0 in | Wt 253.8 lb

## 2018-10-07 DIAGNOSIS — R2232 Localized swelling, mass and lump, left upper limb: Secondary | ICD-10-CM | POA: Diagnosis not present

## 2018-10-07 DIAGNOSIS — Z6836 Body mass index (BMI) 36.0-36.9, adult: Secondary | ICD-10-CM

## 2018-10-07 NOTE — Progress Notes (Signed)
   Chief Complaint:  Charles Reid is a 68 y.o. male who presents today with a chief complaint of hand lump.   Assessment/Plan:  Hand Mass Will place referral to hand surgery for biopsy consideration.  Body mass index is 36.42 kg/m. / Morbid Obesity BMI Metric Follow Up - 10/07/18 1147      BMI Metric Follow Up-Please document annually   BMI Metric Follow Up  Education provided         Subjective:  HPI:  Hand Lump Present for the past several months.  Located on palm of left hand.  Increasing in size over that time.  No obvious precipitating events.  No treatments tried.  ROS: Per HPI  PMH: He reports that he has never smoked. He has never used smokeless tobacco. He reports current alcohol use. He reports that he does not use drugs.      Objective:  Physical Exam: BP 134/84 (BP Location: Left Arm, Patient Position: Sitting, Cuff Size: Normal)   Pulse 94   Temp 98.4 F (36.9 C) (Oral)   Ht 5\' 10"  (1.778 m)   Wt 253 lb 12.8 oz (115.1 kg)   SpO2 94%   BMI 36.42 kg/m   Gen: NAD, resting comfortably Hand: Small pea-sized lump on thenar eminence freely mobile.  Nonpainful to palpation.     Charles Reid. Charles Pain, MD 10/07/2018 11:46 AM

## 2018-10-11 ENCOUNTER — Telehealth: Payer: Self-pay | Admitting: Family Medicine

## 2018-10-11 ENCOUNTER — Other Ambulatory Visit: Payer: Self-pay

## 2018-10-11 DIAGNOSIS — R69 Illness, unspecified: Secondary | ICD-10-CM | POA: Diagnosis not present

## 2018-10-11 MED ORDER — GLUCOSE BLOOD VI STRP: ORAL_STRIP | 12 refills | Status: AC

## 2018-10-11 NOTE — Telephone Encounter (Signed)
Caller name: Brevyn Ring  Relation to pt: spouse  Call back number: [phone]  [id number] [phone](Phone) [phone](Fax)     Reason for call:  Wife states pharmacy sent request for test strips for Blood Glucose Monitoring Suppl (ONE TOUCH ULTRA 2) w/Device KIT, patient unable to check blood sugar. Wife would like strips sent in today, please advise

## 2018-10-11 NOTE — Telephone Encounter (Signed)
See note

## 2018-10-11 NOTE — Telephone Encounter (Signed)
Notified via voicemail Rx sent

## 2018-10-16 ENCOUNTER — Other Ambulatory Visit: Payer: Self-pay

## 2018-10-16 MED ORDER — ONETOUCH ULTRA VI STRP: ORAL_STRIP | 12 refills | Status: AC

## 2018-10-24 DIAGNOSIS — R2232 Localized swelling, mass and lump, left upper limb: Secondary | ICD-10-CM | POA: Diagnosis not present

## 2018-11-24 ENCOUNTER — Encounter (HOSPITAL_COMMUNITY): Admission: EM | Disposition: E | Payer: Self-pay | Source: Home / Self Care | Attending: Pulmonary Disease

## 2018-11-24 ENCOUNTER — Encounter (HOSPITAL_COMMUNITY): Payer: Self-pay | Admitting: Medical

## 2018-11-24 ENCOUNTER — Inpatient Hospital Stay (HOSPITAL_COMMUNITY): Payer: Medicare HMO

## 2018-11-24 ENCOUNTER — Other Ambulatory Visit: Payer: Self-pay

## 2018-11-24 ENCOUNTER — Encounter (HOSPITAL_COMMUNITY): Payer: Self-pay

## 2018-11-24 ENCOUNTER — Emergency Department (HOSPITAL_COMMUNITY): Payer: Medicare HMO

## 2018-11-24 ENCOUNTER — Inpatient Hospital Stay (HOSPITAL_COMMUNITY)
Admission: EM | Admit: 2018-11-24 | Discharge: 2018-12-26 | DRG: 286 | Disposition: E | Payer: Medicare HMO | Attending: Pulmonary Disease | Admitting: Pulmonary Disease

## 2018-11-24 ENCOUNTER — Other Ambulatory Visit (HOSPITAL_COMMUNITY): Payer: Self-pay

## 2018-11-24 DIAGNOSIS — I10 Essential (primary) hypertension: Secondary | ICD-10-CM | POA: Diagnosis present

## 2018-11-24 DIAGNOSIS — R0689 Other abnormalities of breathing: Secondary | ICD-10-CM | POA: Diagnosis not present

## 2018-11-24 DIAGNOSIS — Z8674 Personal history of sudden cardiac arrest: Secondary | ICD-10-CM

## 2018-11-24 DIAGNOSIS — I4901 Ventricular fibrillation: Secondary | ICD-10-CM | POA: Diagnosis not present

## 2018-11-24 DIAGNOSIS — E876 Hypokalemia: Secondary | ICD-10-CM | POA: Diagnosis present

## 2018-11-24 DIAGNOSIS — E119 Type 2 diabetes mellitus without complications: Secondary | ICD-10-CM

## 2018-11-24 DIAGNOSIS — Z515 Encounter for palliative care: Secondary | ICD-10-CM | POA: Diagnosis not present

## 2018-11-24 DIAGNOSIS — I959 Hypotension, unspecified: Secondary | ICD-10-CM | POA: Diagnosis present

## 2018-11-24 DIAGNOSIS — G935 Compression of brain: Secondary | ICD-10-CM | POA: Diagnosis present

## 2018-11-24 DIAGNOSIS — E872 Acidosis: Secondary | ICD-10-CM | POA: Diagnosis present

## 2018-11-24 DIAGNOSIS — Z03818 Encounter for observation for suspected exposure to other biological agents ruled out: Secondary | ICD-10-CM | POA: Diagnosis not present

## 2018-11-24 DIAGNOSIS — G931 Anoxic brain damage, not elsewhere classified: Secondary | ICD-10-CM | POA: Diagnosis present

## 2018-11-24 DIAGNOSIS — J9601 Acute respiratory failure with hypoxia: Secondary | ICD-10-CM | POA: Diagnosis not present

## 2018-11-24 DIAGNOSIS — R918 Other nonspecific abnormal finding of lung field: Secondary | ICD-10-CM | POA: Diagnosis not present

## 2018-11-24 DIAGNOSIS — E785 Hyperlipidemia, unspecified: Secondary | ICD-10-CM | POA: Diagnosis present

## 2018-11-24 DIAGNOSIS — J9690 Respiratory failure, unspecified, unspecified whether with hypoxia or hypercapnia: Secondary | ICD-10-CM

## 2018-11-24 DIAGNOSIS — Z6841 Body Mass Index (BMI) 40.0 and over, adult: Secondary | ICD-10-CM | POA: Diagnosis not present

## 2018-11-24 DIAGNOSIS — I428 Other cardiomyopathies: Secondary | ICD-10-CM | POA: Diagnosis present

## 2018-11-24 DIAGNOSIS — Z8249 Family history of ischemic heart disease and other diseases of the circulatory system: Secondary | ICD-10-CM

## 2018-11-24 DIAGNOSIS — I462 Cardiac arrest due to underlying cardiac condition: Secondary | ICD-10-CM | POA: Diagnosis not present

## 2018-11-24 DIAGNOSIS — Z452 Encounter for adjustment and management of vascular access device: Secondary | ICD-10-CM

## 2018-11-24 DIAGNOSIS — R0902 Hypoxemia: Secondary | ICD-10-CM | POA: Diagnosis not present

## 2018-11-24 DIAGNOSIS — R7989 Other specified abnormal findings of blood chemistry: Secondary | ICD-10-CM | POA: Diagnosis present

## 2018-11-24 DIAGNOSIS — J9600 Acute respiratory failure, unspecified whether with hypoxia or hypercapnia: Secondary | ICD-10-CM

## 2018-11-24 DIAGNOSIS — I469 Cardiac arrest, cause unspecified: Secondary | ICD-10-CM | POA: Diagnosis not present

## 2018-11-24 DIAGNOSIS — N179 Acute kidney failure, unspecified: Secondary | ICD-10-CM | POA: Diagnosis present

## 2018-11-24 DIAGNOSIS — R402112 Coma scale, eyes open, never, at arrival to emergency department: Secondary | ICD-10-CM | POA: Diagnosis present

## 2018-11-24 DIAGNOSIS — I499 Cardiac arrhythmia, unspecified: Secondary | ICD-10-CM | POA: Diagnosis not present

## 2018-11-24 DIAGNOSIS — Z4682 Encounter for fitting and adjustment of non-vascular catheter: Secondary | ICD-10-CM | POA: Diagnosis not present

## 2018-11-24 DIAGNOSIS — R4020 Unspecified coma: Secondary | ICD-10-CM | POA: Diagnosis not present

## 2018-11-24 DIAGNOSIS — G936 Cerebral edema: Secondary | ICD-10-CM | POA: Diagnosis not present

## 2018-11-24 DIAGNOSIS — Z20828 Contact with and (suspected) exposure to other viral communicable diseases: Secondary | ICD-10-CM | POA: Diagnosis not present

## 2018-11-24 DIAGNOSIS — R569 Unspecified convulsions: Secondary | ICD-10-CM | POA: Diagnosis not present

## 2018-11-24 DIAGNOSIS — K7200 Acute and subacute hepatic failure without coma: Secondary | ICD-10-CM | POA: Diagnosis not present

## 2018-11-24 DIAGNOSIS — D72820 Lymphocytosis (symptomatic): Secondary | ICD-10-CM | POA: Diagnosis not present

## 2018-11-24 DIAGNOSIS — R402212 Coma scale, best verbal response, none, at arrival to emergency department: Secondary | ICD-10-CM | POA: Diagnosis not present

## 2018-11-24 DIAGNOSIS — I472 Ventricular tachycardia: Secondary | ICD-10-CM | POA: Diagnosis not present

## 2018-11-24 DIAGNOSIS — Z66 Do not resuscitate: Secondary | ICD-10-CM | POA: Diagnosis not present

## 2018-11-24 DIAGNOSIS — R402312 Coma scale, best motor response, none, at arrival to emergency department: Secondary | ICD-10-CM | POA: Diagnosis present

## 2018-11-24 DIAGNOSIS — I517 Cardiomegaly: Secondary | ICD-10-CM | POA: Diagnosis not present

## 2018-11-24 DIAGNOSIS — I2699 Other pulmonary embolism without acute cor pulmonale: Secondary | ICD-10-CM | POA: Diagnosis not present

## 2018-11-24 DIAGNOSIS — E669 Obesity, unspecified: Secondary | ICD-10-CM | POA: Diagnosis present

## 2018-11-24 DIAGNOSIS — R404 Transient alteration of awareness: Secondary | ICD-10-CM | POA: Diagnosis not present

## 2018-11-24 DIAGNOSIS — J811 Chronic pulmonary edema: Secondary | ICD-10-CM | POA: Diagnosis not present

## 2018-11-24 DIAGNOSIS — I452 Bifascicular block: Secondary | ICD-10-CM | POA: Diagnosis present

## 2018-11-24 HISTORY — PX: LEFT HEART CATH AND CORONARY ANGIOGRAPHY: CATH118249

## 2018-11-24 HISTORY — DX: Type 2 diabetes mellitus without complications: E11.9

## 2018-11-24 HISTORY — DX: Hyperlipidemia, unspecified: E78.5

## 2018-11-24 HISTORY — PX: CORONARY/GRAFT ACUTE MI REVASCULARIZATION: CATH118305

## 2018-11-24 LAB — POCT I-STAT 7, (LYTES, BLD GAS, ICA,H+H)
Acid-base deficit: 6 mmol/L — ABNORMAL HIGH (ref 0.0–2.0)
Acid-base deficit: 14 mmol/L — ABNORMAL HIGH (ref 0.0–2.0)
Bicarbonate: 20.8 mmol/L (ref 20.0–28.0)
Bicarbonate: 14 mmol/L — ABNORMAL LOW (ref 20.0–28.0)
Calcium, Ion: 1 mmol/L — ABNORMAL LOW (ref 1.15–1.40)
Calcium, Ion: 0.97 mmol/L — ABNORMAL LOW (ref 1.15–1.40)
HCT: 44 % (ref 39.0–52.0)
HCT: 41 % (ref 39.0–52.0)
Hemoglobin: 15 g/dL (ref 13.0–17.0)
Hemoglobin: 13.9 g/dL (ref 13.0–17.0)
O2 Saturation: 92 %
O2 Saturation: 92 %
Patient temperature: 33.1
Patient temperature: 32.4
Potassium: 3.6 mmol/L (ref 3.5–5.1)
Potassium: 3.1 mmol/L — ABNORMAL LOW (ref 3.5–5.1)
Sodium: 143 mmol/L (ref 135–145)
Sodium: 142 mmol/L (ref 135–145)
TCO2: 22 mmol/L (ref 22–32)
TCO2: 15 mmol/L — ABNORMAL LOW (ref 22–32)
pCO2 arterial: 37.5 mmHg (ref 32.0–48.0)
pCO2 arterial: 31.6 mmHg — ABNORMAL LOW (ref 32.0–48.0)
pH, Arterial: 7.333 — ABNORMAL LOW (ref 7.350–7.450)
pH, Arterial: 7.227 — ABNORMAL LOW (ref 7.350–7.450)
pO2, Arterial: 55 mmHg — ABNORMAL LOW (ref 83.0–108.0)
pO2, Arterial: 60 mmHg — ABNORMAL LOW (ref 83.0–108.0)

## 2018-11-24 LAB — COMPREHENSIVE METABOLIC PANEL
ALT: 161 U/L — ABNORMAL HIGH (ref 0–44)
AST: 374 U/L — ABNORMAL HIGH (ref 15–41)
Albumin: 3 g/dL — ABNORMAL LOW (ref 3.5–5.0)
Alkaline Phosphatase: 57 U/L (ref 38–126)
Anion gap: 19 — ABNORMAL HIGH (ref 5–15)
BUN: 13 mg/dL (ref 8–23)
CO2: 19 mmol/L — ABNORMAL LOW (ref 22–32)
Calcium: 8.1 mg/dL — ABNORMAL LOW (ref 8.9–10.3)
Chloride: 103 mmol/L (ref 98–111)
Creatinine, Ser: 1.55 mg/dL — ABNORMAL HIGH (ref 0.61–1.24)
GFR calc Af Amer: 53 mL/min — ABNORMAL LOW (ref >60)
GFR calc non Af Amer: 46 mL/min — ABNORMAL LOW (ref >60)
Glucose, Bld: 405 mg/dL — ABNORMAL HIGH (ref 70–99)
Potassium: 3.1 mmol/L — ABNORMAL LOW (ref 3.5–5.1)
Sodium: 141 mmol/L (ref 135–145)
Total Bilirubin: 0.6 mg/dL (ref 0.3–1.2)
Total Protein: 5.4 g/dL — ABNORMAL LOW (ref 6.5–8.1)

## 2018-11-24 LAB — I-STAT CHEM 8, ED
BUN: 15 mg/dL (ref 8–23)
Calcium, Ion: 1.04 mmol/L — ABNORMAL LOW (ref 1.15–1.40)
Chloride: 103 mmol/L (ref 98–111)
Creatinine, Ser: 1.2 mg/dL (ref 0.61–1.24)
Glucose, Bld: 372 mg/dL — ABNORMAL HIGH (ref 70–99)
HCT: 45 % (ref 39.0–52.0)
Hemoglobin: 15.3 g/dL (ref 13.0–17.0)
Potassium: 3.1 mmol/L — ABNORMAL LOW (ref 3.5–5.1)
Sodium: 142 mmol/L (ref 135–145)
TCO2: 20 mmol/L — ABNORMAL LOW (ref 22–32)

## 2018-11-24 LAB — BLOOD GAS, ARTERIAL
Acid-base deficit: 24.1 mmol/L — ABNORMAL HIGH (ref 0.0–2.0)
Bicarbonate: 3.1 mmol/L — ABNORMAL LOW (ref 20.0–28.0)
FIO2: 0.6
MECHVT: 670 mL
O2 Saturation: 98.5 %
PEEP: 5 cmH2O
Patient temperature: 98.6
RATE: 16 resp/min
pH, Arterial: 7.253 — ABNORMAL LOW (ref 7.350–7.450)
pO2, Arterial: 203 mmHg — ABNORMAL HIGH (ref 83.0–108.0)

## 2018-11-24 LAB — POCT I-STAT, CHEM 8
BUN: 20 mg/dL (ref 8–23)
BUN: 23 mg/dL (ref 8–23)
Calcium, Ion: 1.04 mmol/L — ABNORMAL LOW (ref 1.15–1.40)
Calcium, Ion: 1.12 mmol/L — ABNORMAL LOW (ref 1.15–1.40)
Chloride: 108 mmol/L (ref 98–111)
Chloride: 104 mmol/L (ref 98–111)
Creatinine, Ser: 1.4 mg/dL — ABNORMAL HIGH (ref 0.61–1.24)
Creatinine, Ser: 1.8 mg/dL — ABNORMAL HIGH (ref 0.61–1.24)
Glucose, Bld: 427 mg/dL — ABNORMAL HIGH (ref 70–99)
Glucose, Bld: 481 mg/dL — ABNORMAL HIGH (ref 70–99)
HCT: 48 % (ref 39.0–52.0)
HCT: 46 % (ref 39.0–52.0)
Hemoglobin: 16.3 g/dL (ref 13.0–17.0)
Hemoglobin: 15.6 g/dL (ref 13.0–17.0)
Potassium: 3.1 mmol/L — ABNORMAL LOW (ref 3.5–5.1)
Potassium: 3.8 mmol/L (ref 3.5–5.1)
Sodium: 142 mmol/L (ref 135–145)
Sodium: 141 mmol/L (ref 135–145)
TCO2: 16 mmol/L — ABNORMAL LOW (ref 22–32)
TCO2: 16 mmol/L — ABNORMAL LOW (ref 22–32)

## 2018-11-24 LAB — BASIC METABOLIC PANEL
Anion gap: 13 (ref 5–15)
BUN: 14 mg/dL (ref 8–23)
CO2: 9 mmol/L — ABNORMAL LOW (ref 22–32)
Calcium: 4.5 mg/dL — CL (ref 8.9–10.3)
Chloride: 121 mmol/L — ABNORMAL HIGH (ref 98–111)
Creatinine, Ser: 1.01 mg/dL (ref 0.61–1.24)
GFR calc Af Amer: >60 mL/min (ref >60)
GFR calc non Af Amer: >60 mL/min (ref >60)
Glucose, Bld: 272 mg/dL — ABNORMAL HIGH (ref 70–99)
Potassium: 3.2 mmol/L — ABNORMAL LOW (ref 3.5–5.1)
Sodium: 143 mmol/L (ref 135–145)

## 2018-11-24 LAB — HEMOGLOBIN A1C
Hgb A1c MFr Bld: 8.3 % — ABNORMAL HIGH (ref 4.8–5.6)
Mean Plasma Glucose: 191.51 mg/dL

## 2018-11-24 LAB — CBC WITH DIFFERENTIAL/PLATELET
Abs Immature Granulocytes: 0.3 10*3/uL — ABNORMAL HIGH (ref 0.00–0.07)
Band Neutrophils: 2 %
Basophils Absolute: 0 10*3/uL (ref 0.0–0.1)
Basophils Relative: 0 %
Eosinophils Absolute: 0.3 10*3/uL (ref 0.0–0.5)
Eosinophils Relative: 2 %
HCT: 45 % (ref 39.0–52.0)
Hemoglobin: 14.1 g/dL (ref 13.0–17.0)
Lymphocytes Relative: 64 %
Lymphs Abs: 9.5 10*3/uL — ABNORMAL HIGH (ref 0.7–4.0)
MCH: 34.4 pg — ABNORMAL HIGH (ref 26.0–34.0)
MCHC: 31.3 g/dL (ref 30.0–36.0)
MCV: 109.8 fL — ABNORMAL HIGH (ref 80.0–100.0)
Metamyelocytes Relative: 2 %
Monocytes Absolute: 0 10*3/uL — ABNORMAL LOW (ref 0.1–1.0)
Monocytes Relative: 0 %
Neutro Abs: 4.8 10*3/uL (ref 1.7–7.7)
Neutrophils Relative %: 30 %
Platelets: 160 10*3/uL (ref 150–400)
RBC: 4.1 MIL/uL — ABNORMAL LOW (ref 4.22–5.81)
RDW: 12.5 % (ref 11.5–15.5)
WBC: 14.9 10*3/uL — ABNORMAL HIGH (ref 4.0–10.5)
nRBC: 0 % (ref 0.0–0.2)

## 2018-11-24 LAB — BRAIN NATRIURETIC PEPTIDE: B Natriuretic Peptide: 123.6 pg/mL — ABNORMAL HIGH (ref 0.0–100.0)

## 2018-11-24 LAB — CBC
HCT: 42.4 % (ref 39.0–52.0)
Hemoglobin: 14.3 g/dL (ref 13.0–17.0)
MCH: 34.7 pg — ABNORMAL HIGH (ref 26.0–34.0)
MCHC: 33.7 g/dL (ref 30.0–36.0)
MCV: 102.9 fL — ABNORMAL HIGH (ref 80.0–100.0)
Platelets: 193 10*3/uL (ref 150–400)
RBC: 4.12 MIL/uL — ABNORMAL LOW (ref 4.22–5.81)
RDW: 12.7 % (ref 11.5–15.5)
WBC: 17.4 10*3/uL — ABNORMAL HIGH (ref 4.0–10.5)
nRBC: 0 % (ref 0.0–0.2)

## 2018-11-24 LAB — GLUCOSE, CAPILLARY
Glucose-Capillary: 316 mg/dL — ABNORMAL HIGH (ref 70–99)
Glucose-Capillary: 446 mg/dL — ABNORMAL HIGH (ref 70–99)
Glucose-Capillary: 465 mg/dL — ABNORMAL HIGH (ref 70–99)
Glucose-Capillary: 460 mg/dL — ABNORMAL HIGH (ref 70–99)
Glucose-Capillary: 432 mg/dL — ABNORMAL HIGH (ref 70–99)

## 2018-11-24 LAB — POCT I-STAT EG7
Acid-base deficit: 16 mmol/L — ABNORMAL HIGH (ref 0.0–2.0)
Bicarbonate: 17.6 mmol/L — ABNORMAL LOW (ref 20.0–28.0)
Calcium, Ion: 1.03 mmol/L — ABNORMAL LOW (ref 1.15–1.40)
HCT: 44 % (ref 39.0–52.0)
Hemoglobin: 15 g/dL (ref 13.0–17.0)
O2 Saturation: 55 %
Potassium: 3.1 mmol/L — ABNORMAL LOW (ref 3.5–5.1)
Sodium: 141 mmol/L (ref 135–145)
TCO2: 20 mmol/L — ABNORMAL LOW (ref 22–32)
pCO2, Ven: 77.8 mmHg (ref 44.0–60.0)
pH, Ven: 6.962 — CL (ref 7.250–7.430)
pO2, Ven: 46 mmHg — ABNORMAL HIGH (ref 32.0–45.0)

## 2018-11-24 LAB — MAGNESIUM: Magnesium: 2.1 mg/dL (ref 1.7–2.4)

## 2018-11-24 LAB — LIPASE, BLOOD: Lipase: 92 U/L — ABNORMAL HIGH (ref 11–51)

## 2018-11-24 LAB — TROPONIN I (HIGH SENSITIVITY)
Troponin I (High Sensitivity): 173 ng/L (ref <18)
Troponin I (High Sensitivity): 2978 ng/L (ref <18)
Troponin I (High Sensitivity): 14503 ng/L (ref <18)

## 2018-11-24 LAB — LACTIC ACID, PLASMA
Lactic Acid, Venous: 9.9 mmol/L (ref 0.5–1.9)
Lactic Acid, Venous: 6.8 mmol/L (ref 0.5–1.9)
Lactic Acid, Venous: 8.9 mmol/L (ref 0.5–1.9)

## 2018-11-24 LAB — POCT ACTIVATED CLOTTING TIME: Activated Clotting Time: 81 seconds

## 2018-11-24 LAB — KETONES, URINE: Ketones, ur: NEGATIVE mg/dL

## 2018-11-24 LAB — PROTIME-INR
INR: 1.2 (ref 0.8–1.2)
INR: 1.2 (ref 0.8–1.2)
Prothrombin Time: 15.1 seconds (ref 11.4–15.2)
Prothrombin Time: 15.2 seconds (ref 11.4–15.2)

## 2018-11-24 LAB — APTT: aPTT: 35 seconds (ref 24–36)

## 2018-11-24 LAB — SARS CORONAVIRUS 2 BY RT PCR (HOSPITAL ORDER, PERFORMED IN ~~LOC~~ HOSPITAL LAB): SARS Coronavirus 2: NEGATIVE

## 2018-11-24 SURGERY — CORONARY/GRAFT ACUTE MI REVASCULARIZATION
Anesthesia: LOCAL

## 2018-11-24 MED ORDER — SODIUM CHLORIDE 0.9 % IV SOLN
1.0000 ug/kg/min | INTRAVENOUS | Status: DC
  Filled 2018-11-24: qty 20

## 2018-11-24 MED ORDER — SODIUM BICARBONATE 8.4 % IV SOLN
50.0000 meq | INTRAVENOUS | Status: AC
  Administered 2018-11-24: 50 meq via INTRAVENOUS

## 2018-11-24 MED ORDER — SODIUM BICARBONATE 8.4 % IV SOLN
INTRAVENOUS | Status: DC
  Administered 2018-11-24: 16:00:00 via INTRAVENOUS
  Filled 2018-11-24 (×2): qty 100

## 2018-11-24 MED ORDER — LIDOCAINE HCL (PF) 1 % IJ SOLN
INTRAMUSCULAR | Status: AC
  Filled 2018-11-24: qty 30

## 2018-11-24 MED ORDER — INSULIN REGULAR(HUMAN) IN NACL 100-0.9 UT/100ML-% IV SOLN
INTRAVENOUS | Status: DC
  Administered 2018-11-24: 3.9 [IU]/h via INTRAVENOUS
  Administered 2018-11-25: 07:00:00 9.5 [IU]/h via INTRAVENOUS
  Administered 2018-11-25: 01:00:00 18.5 [IU]/h via INTRAVENOUS
  Administered 2018-11-25: 1.1 [IU]/h via INTRAVENOUS
  Filled 2018-11-24 (×4): qty 100

## 2018-11-24 MED ORDER — MIDAZOLAM HCL 2 MG/2ML IJ SOLN
INTRAMUSCULAR | Status: AC
  Filled 2018-11-24: qty 2

## 2018-11-24 MED ORDER — MIDAZOLAM HCL 2 MG/2ML IJ SOLN: 1.0000 mg | Freq: Once | INTRAMUSCULAR | Status: DC

## 2018-11-24 MED ORDER — MIDAZOLAM 50MG/50ML (1MG/ML) PREMIX INFUSION
1.0000 mg/h | INTRAVENOUS | Status: DC
  Filled 2018-11-24 (×2): qty 50

## 2018-11-24 MED ORDER — MIDAZOLAM HCL 2 MG/2ML IJ SOLN
INTRAMUSCULAR | Status: DC | PRN
  Administered 2018-11-24: 3 mg via INTRAVENOUS

## 2018-11-24 MED ORDER — FENTANYL CITRATE (PF) 100 MCG/2ML IJ SOLN: 50.0000 ug | Freq: Once | INTRAMUSCULAR | Status: DC

## 2018-11-24 MED ORDER — LACTATED RINGERS IV BOLUS
1000.0000 mL | Freq: Once | INTRAVENOUS | Status: AC
  Administered 2018-11-24: 1000 mL via INTRAVENOUS

## 2018-11-24 MED ORDER — NOREPINEPHRINE 4 MG/250ML-% IV SOLN
0.0000 ug/min | INTRAVENOUS | Status: DC
  Administered 2018-11-24: 27 ug/min via INTRAVENOUS
  Administered 2018-11-24: 23:00:00 35 ug/min via INTRAVENOUS
  Administered 2018-11-24: 25 ug/min via INTRAVENOUS
  Administered 2018-11-24: 13:00:00 10 ug/min via INTRAVENOUS
  Filled 2018-11-24 (×3): qty 250

## 2018-11-24 MED ORDER — CISATRACURIUM BOLUS VIA INFUSION
0.1000 mg/kg | Freq: Once | INTRAVENOUS | Status: AC
  Administered 2018-11-24: 16:00:00 11.3 mg via INTRAVENOUS
  Filled 2018-11-24: qty 12

## 2018-11-24 MED ORDER — SODIUM CHLORIDE 0.9 % IV SOLN: 250.0000 mL | INTRAVENOUS | Status: DC | PRN

## 2018-11-24 MED ORDER — FENTANYL 2500MCG IN NS 250ML (10MCG/ML) PREMIX INFUSION
25.0000 ug/h | INTRAVENOUS | Status: DC
  Filled 2018-11-24 (×2): qty 250

## 2018-11-24 MED ORDER — INSULIN ASPART 100 UNIT/ML ~~LOC~~ SOLN: 2.0000 [IU] | SUBCUTANEOUS | Status: DC

## 2018-11-24 MED ORDER — SODIUM CHLORIDE 0.9 % IV SOLN
1.0000 ug/kg/min | INTRAVENOUS | Status: DC
  Administered 2018-11-24: 16:00:00 1 ug/kg/min via INTRAVENOUS
  Filled 2018-11-24: qty 20

## 2018-11-24 MED ORDER — SODIUM CHLORIDE 0.9 % IV SOLN
INTRAVENOUS | Status: AC | PRN
  Administered 2018-11-24: 10 mL/h via INTRAVENOUS

## 2018-11-24 MED ORDER — LIDOCAINE HCL (PF) 1 % IJ SOLN
INTRAMUSCULAR | Status: DC | PRN
  Administered 2018-11-24: 16 mg

## 2018-11-24 MED ORDER — MIDAZOLAM BOLUS VIA INFUSION
1.0000 mg | INTRAVENOUS | Status: DC | PRN
  Filled 2018-11-24: qty 1

## 2018-11-24 MED ORDER — LACTATED RINGERS IV BOLUS
2000.0000 mL | Freq: Once | INTRAVENOUS | Status: AC
  Administered 2018-11-24: 2000 mL via INTRAVENOUS

## 2018-11-24 MED ORDER — ONDANSETRON HCL 4 MG/2ML IJ SOLN: 4.0000 mg | Freq: Four times a day (QID) | INTRAMUSCULAR | Status: DC | PRN

## 2018-11-24 MED ORDER — HYDRALAZINE HCL 20 MG/ML IJ SOLN: 10.0000 mg | INTRAMUSCULAR | Status: AC | PRN

## 2018-11-24 MED ORDER — ASPIRIN 300 MG RE SUPP: 300.0000 mg | RECTAL | Status: DC

## 2018-11-24 MED ORDER — ACETAMINOPHEN 325 MG PO TABS: 650.0000 mg | ORAL_TABLET | ORAL | Status: DC | PRN

## 2018-11-24 MED ORDER — NOREPINEPHRINE 4 MG/250ML-% IV SOLN: 0.0000 ug/min | INTRAVENOUS | Status: DC

## 2018-11-24 MED ORDER — CISATRACURIUM BOLUS VIA INFUSION
0.0500 mg/kg | INTRAVENOUS | Status: DC | PRN
  Filled 2018-11-24: qty 6

## 2018-11-24 MED ORDER — SODIUM CHLORIDE 0.9 % IV SOLN: INTRAVENOUS | Status: DC | PRN

## 2018-11-24 MED ORDER — INSULIN ASPART 100 UNIT/ML ~~LOC~~ SOLN
3.0000 [IU] | SUBCUTANEOUS | Status: DC
  Administered 2018-11-24: 9 [IU] via SUBCUTANEOUS

## 2018-11-24 MED ORDER — IOHEXOL 350 MG/ML SOLN
INTRAVENOUS | Status: DC | PRN
  Administered 2018-11-24: 85 mg via INTRA_ARTERIAL

## 2018-11-24 MED ORDER — POTASSIUM CHLORIDE 10 MEQ/100ML IV SOLN
10.0000 meq | INTRAVENOUS | Status: AC
  Administered 2018-11-24 (×3): 10 meq via INTRAVENOUS
  Filled 2018-11-24 (×3): qty 100

## 2018-11-24 MED ORDER — MIDAZOLAM 50MG/50ML (1MG/ML) PREMIX INFUSION
2.0000 mg/h | INTRAVENOUS | Status: DC
  Administered 2018-11-24 – 2018-11-25 (×2): 2 mg/h via INTRAVENOUS
  Filled 2018-11-24: qty 50

## 2018-11-24 MED ORDER — HEPARIN (PORCINE) IN NACL 1000-0.9 UT/500ML-% IV SOLN
INTRAVENOUS | Status: DC | PRN
  Administered 2018-11-24 (×2): 500 mL

## 2018-11-24 MED ORDER — ARTIFICIAL TEARS OPHTHALMIC OINT
1.0000 "application " | TOPICAL_OINTMENT | Freq: Three times a day (TID) | OPHTHALMIC | Status: DC
  Administered 2018-11-24 – 2018-11-25 (×5): 1 via OPHTHALMIC
  Filled 2018-11-24: qty 3.5

## 2018-11-24 MED ORDER — HEPARIN (PORCINE) IN NACL 1000-0.9 UT/500ML-% IV SOLN
INTRAVENOUS | Status: AC
  Filled 2018-11-24: qty 1000

## 2018-11-24 MED ORDER — LABETALOL HCL 5 MG/ML IV SOLN: 10.0000 mg | INTRAVENOUS | Status: AC | PRN

## 2018-11-24 MED ORDER — POTASSIUM CHLORIDE 10 MEQ/50ML IV SOLN
10.0000 meq | INTRAVENOUS | Status: AC
  Administered 2018-11-24 – 2018-11-25 (×6): 10 meq via INTRAVENOUS
  Filled 2018-11-24 (×6): qty 50

## 2018-11-24 MED ORDER — HEPARIN SODIUM (PORCINE) 5000 UNIT/ML IJ SOLN
5000.0000 [IU] | Freq: Three times a day (TID) | INTRAMUSCULAR | Status: DC
  Administered 2018-11-24 – 2018-11-25 (×2): 5000 [IU] via SUBCUTANEOUS
  Filled 2018-11-24 (×2): qty 1

## 2018-11-24 MED ORDER — CHLORHEXIDINE GLUCONATE 0.12% ORAL RINSE (MEDLINE KIT)
15.0000 mL | Freq: Two times a day (BID) | OROMUCOSAL | Status: DC
  Administered 2018-11-24 – 2018-11-26 (×4): 15 mL via OROMUCOSAL

## 2018-11-24 MED ORDER — CHLORHEXIDINE GLUCONATE CLOTH 2 % EX PADS
6.0000 | MEDICATED_PAD | Freq: Every day | CUTANEOUS | Status: DC
  Administered 2018-11-24 – 2018-11-25 (×2): 6 via TOPICAL

## 2018-11-24 MED ORDER — PANTOPRAZOLE SODIUM 40 MG IV SOLR
40.0000 mg | Freq: Every day | INTRAVENOUS | Status: DC
  Administered 2018-11-24: 22:00:00 40 mg via INTRAVENOUS
  Filled 2018-11-24: qty 40

## 2018-11-24 MED ORDER — ORAL CARE MOUTH RINSE
15.0000 mL | OROMUCOSAL | Status: DC
  Administered 2018-11-24 – 2018-11-26 (×10): 15 mL via OROMUCOSAL

## 2018-11-24 MED ORDER — ASPIRIN 81 MG PO CHEW: 81.0000 mg | CHEWABLE_TABLET | Freq: Every day | ORAL | Status: DC

## 2018-11-24 MED ORDER — METOPROLOL TARTRATE 5 MG/5ML IV SOLN
5.0000 mg | Freq: Once | INTRAVENOUS | Status: AC
  Administered 2018-11-24: 5 mg via INTRAVENOUS
  Filled 2018-11-24: qty 5

## 2018-11-24 MED ORDER — NOREPINEPHRINE 4 MG/250ML-% IV SOLN
INTRAVENOUS | Status: AC
  Filled 2018-11-24: qty 250

## 2018-11-24 MED ORDER — SODIUM CHLORIDE 0.9 % IV SOLN: INTRAVENOUS | Status: AC

## 2018-11-24 MED ORDER — EPINEPHRINE HCL 5 MG/250ML IV SOLN IN NS
0.5000 ug/min | INTRAVENOUS | Status: DC
  Administered 2018-11-24: 6 ug/min via INTRAVENOUS
  Administered 2018-11-25 (×3): 20 ug/min via INTRAVENOUS
  Administered 2018-11-25: 01:00:00 5 ug/min via INTRAVENOUS
  Administered 2018-11-26 (×3): 20 ug/min via INTRAVENOUS
  Filled 2018-11-24 (×13): qty 250

## 2018-11-24 MED ORDER — FENTANYL 2500MCG IN NS 250ML (10MCG/ML) PREMIX INFUSION
100.0000 ug/h | INTRAVENOUS | Status: DC
  Administered 2018-11-24: 50 ug/h via INTRAVENOUS
  Administered 2018-11-25: 150 ug/h via INTRAVENOUS
  Filled 2018-11-24: qty 250

## 2018-11-24 MED ORDER — SODIUM CHLORIDE 0.9 % IV SOLN
INTRAVENOUS | Status: DC
  Administered 2018-11-25: 06:00:00 via INTRAVENOUS

## 2018-11-24 MED ORDER — CALCIUM GLUCONATE-NACL 2-0.675 GM/100ML-% IV SOLN
2.0000 g | Freq: Once | INTRAVENOUS | Status: AC
  Administered 2018-11-24: 2000 mg via INTRAVENOUS
  Filled 2018-11-24: qty 100

## 2018-11-24 MED ORDER — FENTANYL BOLUS VIA INFUSION
25.0000 ug | INTRAVENOUS | Status: DC | PRN
  Filled 2018-11-24: qty 25

## 2018-11-24 MED ORDER — SODIUM BICARBONATE 8.4 % IV SOLN
50.0000 meq | INTRAVENOUS | Status: AC
  Administered 2018-11-24: 16:00:00 50 meq via INTRAVENOUS
  Filled 2018-11-24: qty 50

## 2018-11-24 MED ORDER — SODIUM CHLORIDE 0.9% FLUSH
3.0000 mL | Freq: Two times a day (BID) | INTRAVENOUS | Status: DC
  Administered 2018-11-24 – 2018-11-25 (×2): 3 mL via INTRAVENOUS

## 2018-11-24 MED ORDER — SODIUM CHLORIDE 0.9% FLUSH: 3.0000 mL | INTRAVENOUS | Status: DC | PRN

## 2018-11-24 SURGICAL SUPPLY — 9 items
CATH INFINITI 5FR MULTPACK ANG (CATHETERS) ×2 IMPLANT
KIT ENCORE 26 ADVANTAGE (KITS) ×2 IMPLANT
KIT HEART LEFT (KITS) ×2 IMPLANT
PACK CARDIAC CATHETERIZATION (CUSTOM PROCEDURE TRAY) ×2 IMPLANT
SHEATH PINNACLE 6F 10CM (SHEATH) ×2 IMPLANT
SYR MEDRAD MARK 7 150ML (SYRINGE) ×2 IMPLANT
TRANSDUCER W/STOPCOCK (MISCELLANEOUS) ×2 IMPLANT
TUBING CIL FLEX 10 FLL-RA (TUBING) ×2 IMPLANT
WIRE EMERALD 3MM-J .035X150CM (WIRE) ×2 IMPLANT

## 2018-11-24 NOTE — Procedures (Signed)
Procedure Note  Procedure: triple lumen, central line placement Indications: cardiac arrest, need for pressors, monitoring  Procedure Details Consent: Risks of procedure as well as the alternatives and risks of each were explained to the (patient/caregiver).  Consent for procedure obtained. Time Out: Verified patient identification, verified procedure, site/side was marked, verified correct patient position, special equipment/implants available, medications/allergies/relevent history reviewed, required imaging and test results available.  Performed 7 Fr TLC placed using seldinger technique at left subclavian.  Each port aspirated with easy return of blood, and flushed with NS.  Sterile caps placed. Biopatch and sterile dressing placed aseptically.   CXR to verify placement pending.   Sterile precautions including body drape, sterile hat, gown, gloves used. No apparent complications.   Evaluation Hemodynamic Status: BP stable throughout; O2 sats:  Chest X-ray ordered to verify placement.  CXR: pending.  Bonna Gains, MD PhD 11/13/2018 3:58 PM

## 2018-11-24 NOTE — Progress Notes (Signed)
[facility] Progress Note Patient Name: Charles Reid DOB: 17-May-1950 MRN: [id number]   Date of Service  11/23/2018  HPI/Events of Note  K+ = 3.1 and Creatinine = 1.40.  eICU Interventions  Will replace K+.      Intervention Category Major Interventions: Electrolyte abnormality - evaluation and management  Sommer,Steven Eugene 11/09/2018, 8:24 PM

## 2018-11-24 NOTE — ED Notes (Signed)
I Stat results shown to Dr. Sherry Ruffing.

## 2018-11-24 NOTE — Progress Notes (Signed)
CRITICAL VALUE ALERT  Critical Value:  Lactic acid 8.9  Date & Time Notied:  2018/12/18  1934  Provider Notified: elink  Orders Received/Actions taken: no new orders

## 2018-11-24 NOTE — Progress Notes (Signed)
CRITICAL VALUE ALERT  Critical Value: Troponin 2978; Calcium 4.5  Date & Time Notied:  11/12/2018 1543  Provider Notified: Chase Caller  Orders Received/Actions taken: Calcium Gluconate orders recieved

## 2018-11-24 NOTE — ED Triage Notes (Signed)
Pt began experiencing seizure while watching tv on couch.  When fire arrived for seizure, pt was still breathing, but then arrested.  CPR started immediately started by fire at 1134.  Initial rhythm was asystole, then pea.  Pt shocked with 200 jules and regained pulse.  Given 7 epis en-rout and regained pulses 4 times for approx 5 min each time.

## 2018-11-24 NOTE — Progress Notes (Signed)
Right femoral sheath removed at 5053 with no complications. Pressure held on site for 20 minutes. Vital signs continuously monitored and remained stable throughout procedure. Patient remains  Less than 30 degrees post procedure and site is a Level 0. Will continue to monitor site.

## 2018-11-24 NOTE — Consult Note (Signed)
Checked back with pt's wife. Brought her prayer shawl and prayer for healing sent with it by maker of shawl. She was alone with her husband, who was looking peaceful. She'll stay the night. She was appreciative. Day chaplain will f/u tomorrow. Please call whenever there's further need for chaplain services.   Rev. Eloise Levels Chaplain

## 2018-11-24 NOTE — Progress Notes (Signed)
   Calcium - low / 4.5  Plan 2g calcium gluconate    SIGNATURE    Dr. Brand Males, M.D., F.C.C.P,  Pulmonary and Critical Care Medicine Staff Physician, [place] Director - Interstitial Lung Disease  Program  Pulmonary [place] [facility] [place], [place], [address]  Pager: [phone], If no answer or between  15:00h - 7:00h: call [phone] Telephone: [phone]  5:23 PM 11/12/2018

## 2018-11-24 NOTE — ED Notes (Signed)
3 Gold top tubes sent to main lab to hold.

## 2018-11-24 NOTE — ED Notes (Signed)
Patient going to Cath Lab, was told not to get 2nd blood culture.

## 2018-11-24 NOTE — ED Notes (Signed)
Lucas paused and pulse present.  Epi drip from ems continued at 4 until our drip can be started.

## 2018-11-24 NOTE — Progress Notes (Signed)
Ramaswamy paged regarding K+ of 3.1 and Bicarb of 14 despite 2 amps and a gtt. Awaiting page back will pass on the night shift RN to continue to follow up.

## 2018-11-24 NOTE — Progress Notes (Signed)
eLink Physician-Brief Progress Note Patient Name: Charles Reid DOB: 27-Aug-1950 MRN: [id number]   Date of Service  12/11/18  HPI/Events of Note  Lactic Acid = 6.8 --> 8.9. CVP ordered, however, not done yet. Hgb = 16.3  eICU Interventions  Await CVP measurement prior to Rx of elevated lactic acid.      Intervention Category Major Interventions: Acid-Base disturbance - evaluation and management;Respiratory failure - evaluation and management  Sommer,Steven Cornelia Copa 2018-12-11, 8:13 PM

## 2018-11-24 NOTE — H&P (Addendum)
ATTESTATION & SIGNATURE   STAFF NOTE: I, Dr Ann Lions have personally reviewed patient's available data, including medical history, events of note, physical examination and test results as part of my evaluation. I have discussed with resident/NP and other care providers such as pharmacist, RN and RRT.  In addition,  I personally evaluated patient and elicited key findings of   S: Date of admit Dec 14, 2018 with LOS 0 for today 2018-12-14 : Fate Caster is  Loc durin a Licensed conveyancer. Wife an RT did immediate bystander CPR after fining him pulseless. Initially PEA but later with EMS - VF needing shock. Multiple CPR x 1h but repeat ROSC. Initially needed pressors in ER but after cath lab an on arrival to [facility] - off pressors. Per Dr Gwenlyn FoundGay Filler coronaries - EF 40-45%. Arriving to ICU on vent. ABG in ICU 7/1/<15/X  O:  Blood pressure (!) 177/110, pulse (!) 104, temperature (!) 93.8 F (34.3 C), resp. rate 11, weight 113.4 kg, SpO2 100 %.   intubaed Comatose - orders for nimbex, fent, versed Synch with vent CTA bilaterally Arctic sun pads on   LABS    PULMONARY Recent Labs  Lab 12/14/18 1245  HCO3 17.6*  TCO2 20*  20*  O2SAT 55.0    CBC Recent Labs  Lab 12/14/2018 1243 12-14-18 1245  HGB 14.1 15.0  15.3  HCT 45.0 44.0  45.0  WBC 14.9*  --   PLT 160  --     COAGULATION Recent Labs  Lab December 14, 2018 1243  INR 1.2    CARDIAC  No results for input(s): TROPONINI in the last 168 hours. No results for input(s): PROBNP in the last 168 hours.   CHEMISTRY Recent Labs  Lab 2018-12-14 1243 2018/12/14 1245  NA 141 141  142  K 3.1* 3.1*  3.1*  CL 103 103  CO2 19*  --   GLUCOSE 405* 372*  BUN 13 15  CREATININE 1.55* 1.20  CALCIUM 8.1*  --   MG 2.1  --    CrCl cannot be calculated (Unknown ideal weight.).   LIVER Recent Labs  Lab 2018-12-14 1243  AST 374*  ALT 161*  ALKPHOS 57  BILITOT 0.6  PROT 5.4*  ALBUMIN 3.0*  INR 1.2     INFECTIOUS Recent Labs   Lab 14-Dec-2018 1243  LATICACIDVEN 9.9*     ENDOCRINE CBG (last 3)  No results for input(s): GLUCAP in the last 72 hours.       IMAGING x24h  - image(s) personally visualized  -   highlighted in bold Dg Chest Portable 1 View  Result Date: December 14, 2018 CLINICAL DATA:  Cardiac arrest EXAM: PORTABLE CHEST 1 VIEW COMPARISON:  None. FINDINGS: Endotracheal tube tip is 5.1 cm above the carina. Enteric tube enters stomach with the tip not seen on this image. Pacer pad overlies the lateral lower left chest. Normal cardiac and mediastinal silhouette. No pneumothorax. No pleural effusion. Slightly low lung volumes. No pulmonary edema. No acute consolidative airspace disease. IMPRESSION: Well-positioned support structures. Slightly low lung volumes with no active cardiopulmonary disease. Electronically Signed   By: Ilona Sorrel M.D.   On: 12/14/2018 13:28      A: acute resp failure following cardiac arrest. - clean coronaries at cath Prolonged CPR AT risk for anoxic brain injury  Lactic acidosis - due to above +/- ? Taking metformin/diabletes -   AKI resolved Circulatory shock - resolved  P:  Full vent support Induced hypothermia - 33C goal Deep sedation and paralysis  Fluid bolus 2L and then Start bic bolus and then gtt -> track abg   Anti-infectives (From admission, onward)   None       Rest per NP/medical resident whose note is outlined above and that I agree with  The patient is critically ill with multiple organ systems failure and requires high complexity decision making for assessment and support, frequent evaluation and titration of therapies, application of advanced monitoring technologies and extensive interpretation of multiple databases.   Critical Care Time devoted to patient care services described in this note is  30  Minutes. This time reflects time of care of this signee Dr Kalman Shan. This critical care time does not reflect procedure time, or teaching time or  supervisory time of PA/NP/Med student/Med Resident etc but could involve care discussion time     Dr. Kalman Shan, M.D., [facility].C.P Pulmonary and Critical Care Medicine Staff Physician [facility] and Critical Care Pager: [phone], If no answer or between  15:00h - 7:00h: call [phone]  12/20/2018 3:17 PM

## 2018-11-24 NOTE — ED Notes (Signed)
Cardiology at bedside.

## 2018-11-24 NOTE — ED Notes (Signed)
Ice placed for cooling per dr tegeler

## 2018-11-24 NOTE — Consult Note (Signed)
Responded to page, visited with family in consult A: wife, daughter, son-in-law, niece. Conferred with doctor who had shared  initial information re: pt's status with family, and passed on to them that he or the heart doctor would be back with more information when they knew it. Offered spiritual/emotional support and prayer--which seemed to be a comfort to them. Family is [place]. Will bring wife and daughter water on way to a death on the floor. Please page again when further chaplain services needed.  Rev. Eloise Levels Chaplain

## 2018-11-24 NOTE — Procedures (Signed)
Arterial Catheter Insertion Procedure Note Charles Reid [id number] 31-Jan-1951  Procedure: Insertion of Arterial Catheter  Indications: Blood pressure monitoring and Frequent blood sampling  Procedure Details Consent: Unable to obtain consent because of emergent medical necessity. Time Out: Verified patient identification, verified procedure, site/side was marked, verified correct patient position, special equipment/implants available, medications/allergies/relevent history reviewed, required imaging and test results available.  Performed  Maximum sterile technique was used including antiseptics, cap, gloves, gown, hand hygiene, mask and sheet. Skin prep: Chlorhexidine; local anesthetic administered 20 gauge catheter was inserted into left radial artery using the Seldinger technique. ULTRASOUND GUIDANCE USED: NO Evaluation Blood flow good; BP tracing good. Complications: No apparent complications.   Charles Reid December 18, 2018

## 2018-11-24 NOTE — Progress Notes (Signed)
eLink Physician-Brief Progress Note Patient Name: Charles Reid DOB: January 28, 1951 MRN: [id number]   Date of Service  10/30/2018  HPI/Events of Note  Lactic Acid = 8.9 - CVP = 6.   eICU Interventions  Will bolus with LR 1 liter IV over 1 hour now.      Intervention Category Major Interventions: Acid-Base disturbance - evaluation and management  Renise Gillies Eugene 11/07/2018, 8:45 PM

## 2018-11-24 NOTE — Progress Notes (Signed)
RT note: RT and RN transported ventilator patient from ED to cath lab. Vital signs stable through out.

## 2018-11-24 NOTE — ED Provider Notes (Signed)
[facility] Provider Note   CSN: [id number] Arrival date & time: 2018-06-19  1237     History   Chief Complaint Chief Complaint  Patient presents with  . Cardiac Arrest    HPI Charles Reid is a 68 y.o. male.     LVL 5 caveat for cardiac arrest and unresponsiveness  The history is provided by the EMS personnel, a relative, the spouse and medical records. No language interpreter was used.  Cardiac Arrest Witnessed by:  Family member Incident location:  Home Time since incident:  1 hour Time before BLS initiated:  Immediate Condition upon EMS arrival:  Unresponsive Pulse:  Absent Initial cardiac rhythm per EMS:  PEA Treatments prior to arrival:  AED discharged, intubation and ACLS protocol Medications given prior to ED:  Epinephrine Defibrillation prior to arrival:  200 J Number of shocks delivered:  1 Defibrillation successful: yes   Rhythm after defibrillation:  Sinus bradycardia Airway:  Intubation prior to arrival Rhythm on admission to ED:  Junctional Risk factors: diabetes mellitus and hyperlipidemia   Risk factors: no trauma     No past medical history on file.  There are no active problems to display for this patient.    Home Medications    Prior to Admission medications   Not on File    Family History No family history on file.  Social History Social History   Tobacco Use  . Smoking status: Not on file  Substance Use Topics  . Alcohol use: Not on file  . Drug use: Not on file     Allergies   Patient has no allergy information on record.   Review of Systems Review of Systems  Unable to perform ROS: Intubated     Physical Exam Updated Vital Signs BP (!) 124/91   Pulse (!) 56   Temp (!) 93.8 F (34.3 C)   Resp (!) 29   SpO2 98%   Physical Exam Vitals signs and nursing note reviewed.  Constitutional:      General: He is in acute distress.     Appearance: He is well-developed. He is  obese.     Interventions: He is intubated.     Comments: unresponsive   HENT:     Head: Normocephalic.  Eyes:     Conjunctiva/sclera: Conjunctivae normal.     Pupils: Pupils are equal, round, and reactive to light.  Cardiovascular:     Rate and Rhythm: Regular rhythm. Bradycardia present.  Extrasystoles are present.    Pulses: Normal pulses.     Heart sounds: No murmur.  Pulmonary:     Effort: He is intubated.     Breath sounds: Rhonchi present.     Comments: Some rhonchi on exam but patient is intubated. Chest:     Chest wall: No tenderness.  Abdominal:     Palpations: Abdomen is soft.     Tenderness: There is no abdominal tenderness.  Musculoskeletal:        General: No tenderness.  Skin:    General: Skin is dry.     Capillary Refill: Capillary refill takes less than 2 seconds.  Neurological:     Mental Status: He is unresponsive.     GCS: GCS eye subscore is 1. GCS verbal subscore is 1. GCS motor subscore is 1.     Comments: Pupils are reactive and symmetric at 4 mm.  Patient is intubated, not sedated.  No response to pain.      ED Treatments /  Results  Labs (all labs ordered are listed, but only abnormal results are displayed) Labs Reviewed  CBC WITH DIFFERENTIAL/PLATELET - Abnormal; Notable for the following components:      Result Value   WBC 14.9 (*)    RBC 4.10 (*)    MCV 109.8 (*)    MCH 34.4 (*)    Lymphs Abs 9.5 (*)    Monocytes Absolute 0.0 (*)    Abs Immature Granulocytes 0.30 (*)    All other components within normal limits  COMPREHENSIVE METABOLIC PANEL - Abnormal; Notable for the following components:   Potassium 3.1 (*)    CO2 19 (*)    Glucose, Bld 405 (*)    Creatinine, Ser 1.55 (*)    Calcium 8.1 (*)    Total Protein 5.4 (*)    Albumin 3.0 (*)    AST 374 (*)    ALT 161 (*)    GFR calc non Af Amer 46 (*)    GFR calc Af Amer 53 (*)    Anion gap 19 (*)    All other components within normal limits  LACTIC ACID, PLASMA - Abnormal; Notable  for the following components:   Lactic Acid, Venous 9.9 (*)    All other components within normal limits  LIPASE, BLOOD - Abnormal; Notable for the following components:   Lipase 92 (*)    All other components within normal limits  BRAIN NATRIURETIC PEPTIDE - Abnormal; Notable for the following components:   B Natriuretic Peptide 123.6 (*)    All other components within normal limits  I-STAT CHEM 8, ED - Abnormal; Notable for the following components:   Potassium 3.1 (*)    Glucose, Bld 372 (*)    Calcium, Ion 1.04 (*)    TCO2 20 (*)    All other components within normal limits  POCT I-STAT EG7 - Abnormal; Notable for the following components:   pH, Ven 6.962 (*)    pCO2, Ven 77.8 (*)    pO2, Ven 46.0 (*)    Bicarbonate 17.6 (*)    TCO2 20 (*)    Acid-base deficit 16.0 (*)    Potassium 3.1 (*)    Calcium, Ion 1.03 (*)    All other components within normal limits  TROPONIN I (HIGH SENSITIVITY) - Abnormal; Notable for the following components:   Troponin I (High Sensitivity) 173 (*)    All other components within normal limits  SARS CORONAVIRUS 2 (HOSPITAL ORDER, PERFORMED IN [facility])  CULTURE, BLOOD (ROUTINE X 2)  CULTURE, BLOOD (ROUTINE X 2)  PROTIME-INR  MAGNESIUM  LACTIC ACID, PLASMA  PATHOLOGIST SMEAR REVIEW    EKG EKG Interpretation  Date/Time:  Sunday November 24 2018 12:57:18 EDT Ventricular Rate:  51 PR Interval:    QRS Duration: 115 QT Interval:  551 QTC Calculation: 508 R Axis:   -59 Text Interpretation:  Junctional rhythm Incomplete RBBB and LAFB Borderline low voltage, extremity leads when compared to prior, less PVC and some evidence of ischemia with ST elevation in AVR and depressions in 2,3,AVF.  No STEMI  Confirmed by Theda Belfastegeler, Chris ([id number]) on 2018/10/21 1:51:28 PM   Radiology Dg Chest Portable 1 View  Result Date: 2018/10/21 CLINICAL DATA:  Cardiac arrest EXAM: PORTABLE CHEST 1 VIEW COMPARISON:  None. FINDINGS: Endotracheal tube tip  is 5.1 cm above the carina. Enteric tube enters stomach with the tip not seen on this image. Pacer pad overlies the lateral lower left chest. Normal cardiac and mediastinal silhouette. No  pneumothorax. No pleural effusion. Slightly low lung volumes. No pulmonary edema. No acute consolidative airspace disease. IMPRESSION: Well-positioned support structures. Slightly low lung volumes with no active cardiopulmonary disease. Electronically Signed   By: Delbert Phenix M.D.   On: 10/26/2018 13:28    Procedures Procedures (including critical care time)  CRITICAL CARE Performed by: Canary Brim  Total critical care time: 45 minutes Critical care time was exclusive of separately billable procedures and treating other patients. Critical care was necessary to treat or prevent imminent or life-threatening deterioration. Critical care was time spent personally by me on the following activities: development of treatment plan with patient and/or surrogate as well as nursing, discussions with consultants, evaluation of patient's response to treatment, examination of patient, obtaining history from patient or surrogate, ordering and performing treatments and interventions, ordering and review of laboratory studies, ordering and review of radiographic studies, pulse oximetry and re-evaluation of patient's condition.   Cardiopulmonary Resuscitation (CPR) Procedure Note Directed/Performed by: Canary Brim  I personally directed ancillary staff and/or performed CPR in an effort to regain return of spontaneous circulation and to maintain cardiac, neuro and systemic perfusion.    Medications Ordered in ED Medications  EPINEPHrine (ADRENALIN) 4 mg in NS 250 mL (0.016 mg/mL) premix infusion (6 mcg/min Intravenous New Bag/Given 10/31/2018 1254)  norepinephrine (LEVOPHED) 4-5 MG/250ML-% infusion SOLN (has no administration in time range)  norepinephrine (LEVOPHED) 4mg  in premix infusion (10 mcg/min  Intravenous New Bag/Given 10/26/2018 1313)  lidocaine (PF) (XYLOCAINE) 1 % injection (has no administration in time range)  Heparin (Porcine) in NaCl 1000-0.9 UT/500ML-% SOLN (has no administration in time range)     Initial Impression / Assessment and Plan / ED Course  I have reviewed the triage vital signs and the nursing notes.  Pertinent labs & imaging results that were available during my care of the patient were reviewed by me and considered in my medical decision making (see chart for details).        Charles Reid is a 68 y.o. male with a past medical history significant for hypertension, hypercholesterolemia, and diabetes who presents for cardiac arrest.  Patient was watching church on June with family when he started snoring and he went unresponsive.  The wife initially thought he may be having seizure but then checked his pulse and he was pulseless.  She called 911 and started CPR immediately.  According to EMS, patient had approximately 5 rounds of epi in route and was on epi drip at 1 point.  He had several episodes of return of spontaneous circulation before lost it.  Upon arrival, regained pulses and has not lost them since.  They report that they paced him briefly but they had stopped that.  They intubated patient in the field without difficulty.  On arrival, airway appears to be intact, breath sounds equal bilaterally.  Initial blood pressure is hypotensive after he had Rosc.  He will be continued on the epi drip and then a Levophed drip was started.  Patient had pulses in all extremities on my exam.  Breath sounds were rhonchorous and abdomen was nontender.  Initial EKG showed concern for STEMI.  Code STEMI was called and cardiology will come to bedside.  Review of EMS EKG also showed concern for STEMI.  Patient had cooling protocol initiated.  Patient will get screening labs.  Spoke with family who arrived and report that patient has had absolutely no symptoms leading up to this  episode.  He has had no recent fevers  chills congestion, cough, nausea, vomiting, urinary symptoms or GI symptoms.  They report that he syncopized and do not feel like he actually had a seizure as he was pulseless during the episode.  Suspect it was syncope with posturing.  Critical care was called who will come see patient.  Critical care asked that the patient is going to be here longer, that we place [facility].  This will be performed if cards not take patient to emergency Cath Lab as they are starting to indicate.  Patient's blood pressure has improved and is now 628 systolic.  Patient was seen by cardiology and will go emergently to the Cath Lab.  Will defer further management including antibiotics as there was a lack of any preceding infectious type symptoms.  Critical care will also see patient after cath lab.    Final Clinical Impressions(s) / ED Diagnoses   Final diagnoses:  Cardiac arrest ([place])    Clinical Impression: 1. Cardiac arrest [facility])     Disposition: Admit  This note was prepared with assistance of Dragon voice recognition software. Occasional wrong-word or sound-a-like substitutions may have occurred due to the inherent limitations of voice recognition software.     , Gwenyth Allegra, MD 11/02/2018 [phone]

## 2018-11-24 NOTE — Consult Note (Signed)
Checked back with family in consult rm A, 2 more had joined wife, daughter, son-in-law, niece with whom I'd prayed earlier. Pt had gone to Cath Lab, nurse said wife had been bedside before that. Wife confirmed she had spoken with doctors again. They are awaiting further news. Staff will page again if further chaplain services needed.   Rev. Eloise Levels Chaplain

## 2018-11-24 NOTE — Consult Note (Signed)
Checked back with ED & 3 family members still in ED consult rm A, & pt's wife & daughter waiting in [place] general waiting area while [place] procedures are completed. ED did not know why family members were still in consult rm A, and security guard at clearance desk said he had no problem if they went up to general 2H waiting area. Checked with pt's nurse. She said only 2 family members could come back, and she would be ready for them to do that within the hour.  She did not mind if more family members waited in general [place] waiting area. I escorted them there. They were appreciative of being able to wait together, though 2 of family members who had been in consult rm A decided to console pt's wife, then leave. So pt's son-in-law has now joined his wife, pt's daughter, with pt's wife in the general 2H waiting area, and only those 3 family members are there now. I let them know about cafeteria, [employer], and chapel. Wife plans to stay as long as can with pt. Please page again if further chaplain services needed.   Rev. Eloise Levels Chaplain

## 2018-11-24 NOTE — Progress Notes (Signed)
Transport patient from cath lab to [facility]. Advanced ET from 26 to 28 ATL due to CXR.

## 2018-11-24 NOTE — ED Notes (Signed)
Dr berry at bedside.

## 2018-11-24 NOTE — H&P (Signed)
PULMONARY / CRITICAL CARE MEDICINE   NAME:  Charles Reid, MRN:  [id number], DOB:  Sep 19, 1950, LOS: 0 ADMISSION DATE:  11/20/2018, CONSULTATION DATE:  8/30  REFERRING MD:  EDP Tegeler MD  , CHIEF COMPLAINT:  Cardiac Arrest   BRIEF HISTORY:    68 yo male with DM, HTN with witnessed arrest at home with immediate bystander CPR . CPR x 1 hr w/ ROSC . On arrival to ER , elevated troponin and EKG changes, taken to cath lab with non ischemic cardiomyopathy , EF 35-45% . PCCM to admit. TTM started.   HISTORY OF PRESENT ILLNESS   68 year old male minimum smoking history with no medical history of hypertension, hyperlipidemia and diabetes with witnessed cardiac arrest at home with immediate bystander CPR.  Per wife patient was watching online church this morning in usual state of good health.  Wife said she heard a grunting or snoring type sound collapsed while sitting.  Patient's wife is a respiratory therapist.  She activated EMS and started immediate CPR as he was pulseless.  On EMS arrival patient was reported to be in PEA.  CPR was continued.  During CPR patient did have V. fib requiring shock.  He had multiple rounds of CPR lasting around 1 hour total. In the emergency room patient was started on an epi drip and required Levophed for hypotension.  Chest x-ray was essentially clear.  Initial troponin very high at 173.  . lactic acid was elevated at 9.9.  Potassium 3.1.  WBC 14.9 EKG consistent with ischemia.  Patient was taken to the cardiac catheterization found to have a nonischemic cardiomyopathy with clean coronary arteries and mild to moderate LV dysfunction EF 35-45%.  Concerned that his arrest  was most likely arrhythmogenic in nature.   TTM (Ice packs ) started in cath lab . On arrival to ICU , patient unresponsive on no sedation . Appears comfortable on vent . Temp on arrival 34 degrees C . TTM pads started.  B/p improved and weaned off pressors .    SIGNIFICANT PAST MEDICAL HISTORY   Hypertension,  hyperlipidemia, diabetes  SIGNIFICANT EVENTS:  8/30-cardiac arrest, CPR x1 hour with ROSC, taken to the cardiac Cath Lab, TTM started in cath lab .   STUDIES:   8/30 cardiac cath> nonischemic cardiomyopathy, normal coronaries, EF 35 to 45%  CULTURES:  8/30 SARS CoV2 >Neg  8/30 BC x 2 >   ANTIBIOTICS:  Na   LINES/TUBES:  8/30 ETT > >  CONSULTANTS:  Card   SUBJECTIVE:  Return from cath lab, weaned off pressors , unresponsive on vent   CONSTITUTIONAL: BP (!) 124/91   Pulse (!) 56   Temp (!) 93.8 F (34.3 C)   Resp (!) 29   Wt 113.4 kg   SpO2 100%   No intake/output data recorded.     Vent Mode: PRVC FiO2 (%):  [100 %] 100 % Set Rate:  [16 bmp] 16 bmp Vt Set:  [640 mL] 640 mL PEEP:  [10 cmH20] 10 cmH20 Plateau Pressure:  [21 cmH20] 21 cmH20  PHYSICAL EXAM: General: Elderly man unresponsive on vent Neuro: Unresponsive HEENT: AT/[place], MMM, ETT Cardiovascular: S1-S2, no MRG Lungs: Coarse breath sounds bilaterally no wheezing Abdomen: Soft, hypoactive bowel sounds Musculoskeletal: Intact, no edema Skin: Intact without rash  RESOLVED PROBLEM LIST   ASSESSMENT AND PLAN   Acute hypoxic respiratory failure in setting of cardiac arrest  -Continue vent support -VAP -Daily assessment with SBT/wean -Check ABG -Trend chest x-ray  Cardiac arrest Elevated  troponin/EKG changes Hypertension Hx  -Status post cardiac cath lab 8/30 Hypotension/possible cardiogenic shock  -Begin TTM 33 degrees -Hold home hypertension medicines -MAP goal greater than 65, restart pressors as needed.  -Trend troponin - 2D echo pending   Diabetes  - Sliding scale  Acute kidney injury most likely secondary to hypoxia Hypokalemia Anion gap metabolic acidosis  -Trend electrolyte panel and replace as indicated  -StrictI/O  -Potassium replaced in cath lab  -Trend lactate  Transaminitis most likely from shock liver Elevated lipase  Trend LFTs Check lipase in a.m. N.p.o.,  consider tube feeds in a.m.  Leukocytosis No obvious source of infection hold on antibiotics for now -Follow culture data Trend temp and WBC  Acute encephalopathy in the setting of prolonged CPR concern for anoxic event  -cont sedation protocol per TTM    SUMMARY OF TODAY'S PLAN:    Best Practice / Goals of Care / Disposition.   DVT PROPHYLAXIS: Hep  SUP:PPI  NUTRITION: NPO  MOBILITY:BR  GOALS OF CARE: Full Code  FAMILY DISCUSSIONS: Wife and Daughter updated  DISPOSITION ICU   LABS  Glucose No results for input(s): GLUCAP in the last 168 hours.  BMET Recent Labs  Lab 10/30/2018 1243 11/14/2018 1245  NA 141 141  142  K 3.1* 3.1*  3.1*  CL 103 103  CO2 19*  --   BUN 13 15  CREATININE 1.55* 1.20  GLUCOSE 405* 372*    Liver Enzymes Recent Labs  Lab 11/07/2018 1243  AST 374*  ALT 161*  ALKPHOS 57  BILITOT 0.6  ALBUMIN 3.0*    Electrolytes Recent Labs  Lab 11/21/2018 1243  CALCIUM 8.1*  MG 2.1    CBC Recent Labs  Lab 11/13/2018 1243 11/12/2018 1245  WBC 14.9*  --   HGB 14.1 15.0  15.3  HCT 45.0 44.0  45.0  PLT 160  --     ABG No results for input(s): PHART, PCO2ART, PO2ART in the last 168 hours.  Coag's Recent Labs  Lab 10/30/2018 1243  INR 1.2    Sepsis Markers Recent Labs  Lab 11/11/2018 1243  LATICACIDVEN 9.9*    Cardiac Enzymes No results for input(s): TROPONINI, PROBNP in the last 168 hours.  PAST MEDICAL HISTORY :   He  has a past medical history of Diabetes type 2, controlled (HCC), Hyperlipidemia, and Hypertension.  PAST SURGICAL HISTORY:  He  has no past surgical history on file.  Not on File  No current facility-administered medications on file prior to encounter.    No current outpatient medications on file prior to encounter.    FAMILY HISTORY:   His family history includes Heart attack in his sister.  SOCIAL HISTORY:  He    REVIEW OF SYSTEMS:    Unable to obtain as patient sedated on vent   Per wife doing  well with no s/sx prior to event /cardiac arrest    Chequita Mofield NP-C  [facility]  [phone]  11/17/2018

## 2018-11-24 NOTE — Progress Notes (Signed)
  Echocardiogram 2D Echocardiogram has been performed.  Charles Reid 12/12/2018, 5:43 PM

## 2018-11-24 NOTE — Consult Note (Addendum)
Cardiology Consultation:   Patient ID: Charles PaneRalph Puleo; [id number]; 23-May-1950   Admit date: 11/09/2018 Date of Consult: 10/30/2018  Primary Care Provider: Ardith DarkParker, Caleb M, MD Primary Cardiologist: [facility]; Dr. Allyson SabalBerry Primary Electrophysiologist:  None   Patient Profile:   Charles Reid is a 68 y.o. male with a PMH of HTN, HLD, and DM type 2, who is being seen today for evaluation of post-cardiac arrest at the request of Dr. Rush Landmarkegeler.  History of Present Illness:   Charles Reid was in his usual state of health until this morning when he was watching church via zoom and suddenly lost consciousness. His wife, who is a respiratory therapist, immediately activated EMS and started CPR after she found him to be pulseless. On EMS arrival, patient was reported to be in PEA. CPR continued and patient regained a pulse, then had subsequent VF requiring shock. Multiple rounds of CPR lasting ~1hr in total per ED staff. Rhythm strips reviewed and show STD and brief STE in aVR. ROSC was achieved prior to arrival to the ED and patient has maintained pulses since that time. He was started on a levophed gtt with stable blood pressures.   Per wife at bedside, patient is relatively healthy overall. He takes medications for his blood pressure, cholesterol, and diabetes. He does have a sister who died from sudden cardiac arrest a few years ago. No prior CAD history in this patient or previous cardiac work-up.    Past Medical History:  Diagnosis Date  . Diabetes type 2, controlled (HCC)   . Hyperlipidemia   . Hypertension        Home Medications: To be reviewed by pharmacy.  Prior to Admission medications   Not on File    Inpatient Medications: Scheduled Meds:  Continuous Infusions: . epinephrine 6 mcg/min (11/11/2018 1254)  . norepinephrine    . norepinephrine (LEVOPHED) Adult infusion 10 mcg/min (11/08/2018 1313)   PRN Meds:   Allergies:   Not on File  Social History:   Social  History   Socioeconomic History  . Marital status: Married    Spouse name: Not on file  . Number of children: Not on file  . Years of education: Not on file  . Highest education level: Not on file  Occupational History  . Not on file  Social Needs  . Financial resource strain: Not on file  . Food insecurity    Worry: Not on file    Inability: Not on file  . Transportation needs    Medical: Not on file    Non-medical: Not on file  Tobacco Use  . Smoking status: Not on file  Substance and Sexual Activity  . Alcohol use: Not on file  . Drug use: Not on file  . Sexual activity: Not on file  Lifestyle  . Physical activity    Days per week: Not on file    Minutes per session: Not on file  . Stress: Not on file  Relationships  . Social Musicianconnections    Talks on phone: Not on file    Gets together: Not on file    Attends religious service: Not on file    Active member of club or organization: Not on file    Attends meetings of clubs or organizations: Not on file    Relationship status: Not on file  . Intimate partner violence    Fear of current or ex partner: Not on file    Emotionally abused: Not on file  Physically abused: Not on file    Forced sexual activity: Not on file  Other Topics Concern  . Not on file  Social History Narrative  . Not on file    Family History:    Family History  Problem Relation Age of Onset  . Heart attack Sister      ROS:  Please see the history of present illness.   All other ROS reviewed and negative.     Physical Exam/Data:   Vitals:   12/20/2018 1238 12-20-18 1300 2018/12/20 1315 12/20/18 1330  BP:  (!) 124/91    Pulse:  (!) 44 64 (!) 56  Resp:  17 16 (!) 29  Temp:    (!) 93.8 F (34.3 C)  SpO2: (!) 89% (!) 85% 93% 98%   No intake or output data in the 24 hours ending 12-20-18 1350 There were no vitals filed for this visit. There is no height or weight on file to calculate BMI.  General:  Critically ill obese gentleman,  intubated, unresponsive HEENT: sclera anicteric  Neck: large girth  Vascular: No carotid bruits; distal pulses 2+ bilaterally Cardiac:  normal S1, S2; RRR; no murmurs, rubs, or gallops Lungs:  clear to auscultation bilaterally, no wheezing, rhonchi or rales  Abd: NABS, soft, obese,  Ext: no edema Musculoskeletal:  No deformities, BUE and BLE strength normal and equal Skin: warm and dry  Neuro:  No meaningful movement or response to pain Psych:  Unable to assess due to unresponsiveness  EKG:  The EKG was personally reviewed and demonstrates:  Junctional rhythm, rate 51, STE in aVR, STD in inferolateral leads  Relevant CV Studies: None  Laboratory Data:  Chemistry Recent Labs  Lab 20-Dec-2018 1245  NA 141  142  K 3.1*  3.1*  CL 103  GLUCOSE 372*  BUN 15  CREATININE 1.20    No results for input(s): PROT, ALBUMIN, AST, ALT, ALKPHOS, BILITOT in the last 168 hours. Hematology Recent Labs  Lab December 20, 2018 1243 12-20-18 1245  WBC 14.9*  --   RBC 4.10*  --   HGB 14.1 15.0  15.3  HCT 45.0 44.0  45.0  MCV 109.8*  --   MCH 34.4*  --   MCHC 31.3  --   RDW 12.5  --   PLT 160  --    Cardiac EnzymesNo results for input(s): TROPONINI in the last 168 hours. No results for input(s): TROPIPOC in the last 168 hours.  BNP Recent Labs  Lab December 20, 2018 1243  BNP 123.6*    DDimer No results for input(s): DDIMER in the last 168 hours.  Radiology/Studies:  Dg Chest Portable 1 View  Result Date: 12-20-18 CLINICAL DATA:  Cardiac arrest EXAM: PORTABLE CHEST 1 VIEW COMPARISON:  None. FINDINGS: Endotracheal tube tip is 5.1 cm above the carina. Enteric tube enters stomach with the tip not seen on this image. Pacer pad overlies the lateral lower left chest. Normal cardiac and mediastinal silhouette. No pneumothorax. No pleural effusion. Slightly low lung volumes. No pulmonary edema. No acute consolidative airspace disease. IMPRESSION: Well-positioned support structures. Slightly low lung  volumes with no active cardiopulmonary disease. Electronically Signed   By: Ilona Sorrel M.D.   On: Dec 20, 2018 13:28    Assessment and Plan:   1. Cardiac arrest: s/p cardiac arrest while sitting at home this morning. Reportedly no complaints of chest pain, SOB, or palpitations. Found to be in PEA arrest on EMS arrival, regained pulse with subsequent VF. Multiple rounds of CPR. Initial trop  173. BNP 123. EKG c/f ischemia. Discussed with Dr. Allyson Sabal (STEMI call) who will take patient to the cath lab for coronary angiography.  - Await LHC results - Will check an echocardiogram - Cooling protocol initiated and patient to be admitted to PCCM  - Continue levophed  2. HTN: BP stable on levophed post arrest.  - Pharmacy will need to review home medications - On hold now given need for pressors  3. HLD: no lipids on file - Will check FLP with AM labs  4. DM type 2: no A1C on file - Will add on A1C to labs   For questions or updates, please contact [facility] Please consult [email or web address] for contact info under Cardiology/STEMI.   Signed, Beatriz Stallion, PA-C  2018-11-27 1:50 PM [phone]  Agree with assessment and plan by Judy Pimple PA-C  68 year old married African-American male without prior cardiac history.  Does have a history of non-insulin-requiring diabetes, hypertension hyperlipidemia.  He has a family history of heart disease with a sister who died of sudden death.  He was watching TV with his wife when he lost consciousness.  He had a witnessed cardiac arrest.  His wife, who is a respiratory therapist, initiated immediate bystander CPR and called 911.  His initial rhythm on presentation was PEA.  He got a total of 1 hour of CPR with multiple rounds of epinephrine.  He did have a shockable rhythm.  Had several episodes of restoration of sleep of pulse and rhythm during that 1 hour.Marland Kitchen  He presented to the ER hypotensive and was placed on pressors with improvement in his blood  pressure.  He is intubated.  He did have some ST segment depression but he did not have an injury current on his EKG.  Because of his young age, witnessed onset with positive risk factors including family history I elected to bring him to the Cath Lab to define his anatomy.  Charles Reid, M.D., FACP, [facility], [facility] [address] [place], [place]  [address]  [phone] 11/27/18 2:17 PM

## 2018-11-25 ENCOUNTER — Encounter (HOSPITAL_COMMUNITY): Payer: Self-pay | Admitting: Cardiovascular Disease

## 2018-11-25 ENCOUNTER — Inpatient Hospital Stay (HOSPITAL_COMMUNITY): Payer: Medicare HMO

## 2018-11-25 DIAGNOSIS — J9600 Acute respiratory failure, unspecified whether with hypoxia or hypercapnia: Secondary | ICD-10-CM

## 2018-11-25 LAB — POCT I-STAT 7, (LYTES, BLD GAS, ICA,H+H)
Acid-base deficit: 10 mmol/L — ABNORMAL HIGH (ref 0.0–2.0)
Acid-base deficit: 13 mmol/L — ABNORMAL HIGH (ref 0.0–2.0)
Acid-base deficit: 11 mmol/L — ABNORMAL HIGH (ref 0.0–2.0)
Acid-base deficit: 6 mmol/L — ABNORMAL HIGH (ref 0.0–2.0)
Acid-base deficit: 6 mmol/L — ABNORMAL HIGH (ref 0.0–2.0)
Bicarbonate: 16.6 mmol/L — ABNORMAL LOW (ref 20.0–28.0)
Bicarbonate: 15.6 mmol/L — ABNORMAL LOW (ref 20.0–28.0)
Bicarbonate: 16.5 mmol/L — ABNORMAL LOW (ref 20.0–28.0)
Bicarbonate: 21.7 mmol/L (ref 20.0–28.0)
Bicarbonate: 20.1 mmol/L (ref 20.0–28.0)
Calcium, Ion: 1.05 mmol/L — ABNORMAL LOW (ref 1.15–1.40)
Calcium, Ion: 1.12 mmol/L — ABNORMAL LOW (ref 1.15–1.40)
Calcium, Ion: 1.13 mmol/L — ABNORMAL LOW (ref 1.15–1.40)
Calcium, Ion: 1.14 mmol/L — ABNORMAL LOW (ref 1.15–1.40)
Calcium, Ion: 1.08 mmol/L — ABNORMAL LOW (ref 1.15–1.40)
HCT: 44 % (ref 39.0–52.0)
HCT: 45 % (ref 39.0–52.0)
HCT: 44 % (ref 39.0–52.0)
HCT: 44 % (ref 39.0–52.0)
HCT: 40 % (ref 39.0–52.0)
Hemoglobin: 15 g/dL (ref 13.0–17.0)
Hemoglobin: 15.3 g/dL (ref 13.0–17.0)
Hemoglobin: 15 g/dL (ref 13.0–17.0)
Hemoglobin: 15 g/dL (ref 13.0–17.0)
Hemoglobin: 13.6 g/dL (ref 13.0–17.0)
O2 Saturation: 100 %
O2 Saturation: 97 %
O2 Saturation: 95 %
O2 Saturation: 78 %
O2 Saturation: 94 %
Patient temperature: 33
Patient temperature: 33
Patient temperature: 33.1
Patient temperature: 33.1
Potassium: 3.7 mmol/L (ref 3.5–5.1)
Potassium: 5.1 mmol/L (ref 3.5–5.1)
Potassium: 3.2 mmol/L — ABNORMAL LOW (ref 3.5–5.1)
Potassium: 2.4 mmol/L — CL (ref 3.5–5.1)
Potassium: 2.5 mmol/L — CL (ref 3.5–5.1)
Sodium: 143 mmol/L (ref 135–145)
Sodium: 140 mmol/L (ref 135–145)
Sodium: 146 mmol/L — ABNORMAL HIGH (ref 135–145)
Sodium: 151 mmol/L — ABNORMAL HIGH (ref 135–145)
Sodium: 150 mmol/L — ABNORMAL HIGH (ref 135–145)
TCO2: 18 mmol/L — ABNORMAL LOW (ref 22–32)
TCO2: 17 mmol/L — ABNORMAL LOW (ref 22–32)
TCO2: 18 mmol/L — ABNORMAL LOW (ref 22–32)
TCO2: 23 mmol/L (ref 22–32)
TCO2: 21 mmol/L — ABNORMAL LOW (ref 22–32)
pCO2 arterial: 39.4 mmHg (ref 32.0–48.0)
pCO2 arterial: 38.4 mmHg (ref 32.0–48.0)
pCO2 arterial: 34.1 mmHg (ref 32.0–48.0)
pCO2 arterial: 43.3 mmHg (ref 32.0–48.0)
pCO2 arterial: 36 mmHg (ref 32.0–48.0)
pH, Arterial: 7.232 — ABNORMAL LOW (ref 7.350–7.450)
pH, Arterial: 7.194 — CL (ref 7.350–7.450)
pH, Arterial: 7.27 — ABNORMAL LOW (ref 7.350–7.450)
pH, Arterial: 7.286 — ABNORMAL LOW (ref 7.350–7.450)
pH, Arterial: 7.334 — ABNORMAL LOW (ref 7.350–7.450)
pO2, Arterial: 696 mmHg — ABNORMAL HIGH (ref 83.0–108.0)
pO2, Arterial: 102 mmHg (ref 83.0–108.0)
pO2, Arterial: 72 mmHg — ABNORMAL LOW (ref 83.0–108.0)
pO2, Arterial: 38 mmHg — CL (ref 83.0–108.0)
pO2, Arterial: 60 mmHg — ABNORMAL LOW (ref 83.0–108.0)

## 2018-11-25 LAB — BASIC METABOLIC PANEL
Anion gap: 12 (ref 5–15)
Anion gap: 11 (ref 5–15)
Anion gap: 12 (ref 5–15)
Anion gap: 15 (ref 5–15)
BUN: 22 mg/dL (ref 8–23)
BUN: 23 mg/dL (ref 8–23)
BUN: 20 mg/dL (ref 8–23)
BUN: 23 mg/dL (ref 8–23)
CO2: 21 mmol/L — ABNORMAL LOW (ref 22–32)
CO2: 21 mmol/L — ABNORMAL LOW (ref 22–32)
CO2: 18 mmol/L — ABNORMAL LOW (ref 22–32)
CO2: 19 mmol/L — ABNORMAL LOW (ref 22–32)
Calcium: 7.7 mg/dL — ABNORMAL LOW (ref 8.9–10.3)
Calcium: 7.8 mg/dL — ABNORMAL LOW (ref 8.9–10.3)
Calcium: 6.8 mg/dL — ABNORMAL LOW (ref 8.9–10.3)
Calcium: 7.7 mg/dL — ABNORMAL LOW (ref 8.9–10.3)
Chloride: 112 mmol/L — ABNORMAL HIGH (ref 98–111)
Chloride: 113 mmol/L — ABNORMAL HIGH (ref 98–111)
Chloride: 115 mmol/L — ABNORMAL HIGH (ref 98–111)
Chloride: 114 mmol/L — ABNORMAL HIGH (ref 98–111)
Creatinine, Ser: 2.5 mg/dL — ABNORMAL HIGH (ref 0.61–1.24)
Creatinine, Ser: 2.54 mg/dL — ABNORMAL HIGH (ref 0.61–1.24)
Creatinine, Ser: 2.59 mg/dL — ABNORMAL HIGH (ref 0.61–1.24)
Creatinine, Ser: 2.84 mg/dL — ABNORMAL HIGH (ref 0.61–1.24)
GFR calc Af Amer: 30 mL/min — ABNORMAL LOW (ref >60)
GFR calc Af Amer: 29 mL/min — ABNORMAL LOW (ref >60)
GFR calc Af Amer: 29 mL/min — ABNORMAL LOW (ref >60)
GFR calc Af Amer: 26 mL/min — ABNORMAL LOW (ref >60)
GFR calc non Af Amer: 26 mL/min — ABNORMAL LOW (ref >60)
GFR calc non Af Amer: 25 mL/min — ABNORMAL LOW (ref >60)
GFR calc non Af Amer: 25 mL/min — ABNORMAL LOW (ref >60)
GFR calc non Af Amer: 22 mL/min — ABNORMAL LOW (ref >60)
Glucose, Bld: 254 mg/dL — ABNORMAL HIGH (ref 70–99)
Glucose, Bld: 219 mg/dL — ABNORMAL HIGH (ref 70–99)
Glucose, Bld: 209 mg/dL — ABNORMAL HIGH (ref 70–99)
Glucose, Bld: 199 mg/dL — ABNORMAL HIGH (ref 70–99)
Potassium: 2.8 mmol/L — ABNORMAL LOW (ref 3.5–5.1)
Potassium: 2.7 mmol/L — CL (ref 3.5–5.1)
Potassium: 2 mmol/L — CL (ref 3.5–5.1)
Potassium: 2.5 mmol/L — CL (ref 3.5–5.1)
Sodium: 145 mmol/L (ref 135–145)
Sodium: 145 mmol/L (ref 135–145)
Sodium: 145 mmol/L (ref 135–145)
Sodium: 148 mmol/L — ABNORMAL HIGH (ref 135–145)

## 2018-11-25 LAB — CBC
HCT: 46.2 % (ref 39.0–52.0)
Hemoglobin: 14.9 g/dL (ref 13.0–17.0)
MCH: 34 pg (ref 26.0–34.0)
MCHC: 32.3 g/dL (ref 30.0–36.0)
MCV: 105.5 fL — ABNORMAL HIGH (ref 80.0–100.0)
Platelets: 217 10*3/uL (ref 150–400)
RBC: 4.38 MIL/uL (ref 4.22–5.81)
RDW: 12.7 % (ref 11.5–15.5)
WBC: 16.3 10*3/uL — ABNORMAL HIGH (ref 4.0–10.5)
nRBC: 0 % (ref 0.0–0.2)

## 2018-11-25 LAB — POCT I-STAT, CHEM 8
BUN: 26 mg/dL — ABNORMAL HIGH (ref 8–23)
BUN: 24 mg/dL — ABNORMAL HIGH (ref 8–23)
BUN: 21 mg/dL (ref 8–23)
Calcium, Ion: 1.14 mmol/L — ABNORMAL LOW (ref 1.15–1.40)
Calcium, Ion: 1.17 mmol/L (ref 1.15–1.40)
Calcium, Ion: 1.08 mmol/L — ABNORMAL LOW (ref 1.15–1.40)
Chloride: 105 mmol/L (ref 98–111)
Chloride: 110 mmol/L (ref 98–111)
Chloride: 111 mmol/L (ref 98–111)
Creatinine, Ser: 1.8 mg/dL — ABNORMAL HIGH (ref 0.61–1.24)
Creatinine, Ser: 2.4 mg/dL — ABNORMAL HIGH (ref 0.61–1.24)
Creatinine, Ser: 2.2 mg/dL — ABNORMAL HIGH (ref 0.61–1.24)
Glucose, Bld: 498 mg/dL — ABNORMAL HIGH (ref 70–99)
Glucose, Bld: 210 mg/dL — ABNORMAL HIGH (ref 70–99)
Glucose, Bld: 197 mg/dL — ABNORMAL HIGH (ref 70–99)
HCT: 45 % (ref 39.0–52.0)
HCT: 43 % (ref 39.0–52.0)
HCT: 36 % — ABNORMAL LOW (ref 39.0–52.0)
Hemoglobin: 15.3 g/dL (ref 13.0–17.0)
Hemoglobin: 14.6 g/dL (ref 13.0–17.0)
Hemoglobin: 12.2 g/dL — ABNORMAL LOW (ref 13.0–17.0)
Potassium: 4.2 mmol/L (ref 3.5–5.1)
Potassium: 2.7 mmol/L — CL (ref 3.5–5.1)
Potassium: 2.1 mmol/L — CL (ref 3.5–5.1)
Sodium: 141 mmol/L (ref 135–145)
Sodium: 149 mmol/L — ABNORMAL HIGH (ref 135–145)
Sodium: 151 mmol/L — ABNORMAL HIGH (ref 135–145)
TCO2: 17 mmol/L — ABNORMAL LOW (ref 22–32)
TCO2: 22 mmol/L (ref 22–32)
TCO2: 19 mmol/L — ABNORMAL LOW (ref 22–32)

## 2018-11-25 LAB — GLUCOSE, CAPILLARY
Glucose-Capillary: 119 mg/dL — ABNORMAL HIGH (ref 70–99)
Glucose-Capillary: 170 mg/dL — ABNORMAL HIGH (ref 70–99)
Glucose-Capillary: 172 mg/dL — ABNORMAL HIGH (ref 70–99)
Glucose-Capillary: 190 mg/dL — ABNORMAL HIGH (ref 70–99)
Glucose-Capillary: 194 mg/dL — ABNORMAL HIGH (ref 70–99)
Glucose-Capillary: 202 mg/dL — ABNORMAL HIGH (ref 70–99)
Glucose-Capillary: 205 mg/dL — ABNORMAL HIGH (ref 70–99)
Glucose-Capillary: 206 mg/dL — ABNORMAL HIGH (ref 70–99)
Glucose-Capillary: 208 mg/dL — ABNORMAL HIGH (ref 70–99)
Glucose-Capillary: 311 mg/dL — ABNORMAL HIGH (ref 70–99)
Glucose-Capillary: 350 mg/dL — ABNORMAL HIGH (ref 70–99)
Glucose-Capillary: 356 mg/dL — ABNORMAL HIGH (ref 70–99)
Glucose-Capillary: 377 mg/dL — ABNORMAL HIGH (ref 70–99)
Glucose-Capillary: 407 mg/dL — ABNORMAL HIGH (ref 70–99)
Glucose-Capillary: 421 mg/dL — ABNORMAL HIGH (ref 70–99)
Glucose-Capillary: 426 mg/dL — ABNORMAL HIGH (ref 70–99)
Glucose-Capillary: 428 mg/dL — ABNORMAL HIGH (ref 70–99)
Glucose-Capillary: 429 mg/dL — ABNORMAL HIGH (ref 70–99)

## 2018-11-25 LAB — COMPREHENSIVE METABOLIC PANEL
ALT: 182 U/L — ABNORMAL HIGH (ref 0–44)
ALT: 131 U/L — ABNORMAL HIGH (ref 0–44)
AST: 527 U/L — ABNORMAL HIGH (ref 15–41)
AST: 276 U/L — ABNORMAL HIGH (ref 15–41)
Albumin: 3 g/dL — ABNORMAL LOW (ref 3.5–5.0)
Albumin: 2.3 g/dL — ABNORMAL LOW (ref 3.5–5.0)
Alkaline Phosphatase: 53 U/L (ref 38–126)
Alkaline Phosphatase: 47 U/L (ref 38–126)
Anion gap: 15 (ref 5–15)
Anion gap: 16 — ABNORMAL HIGH (ref 5–15)
BUN: 24 mg/dL — ABNORMAL HIGH (ref 8–23)
BUN: 23 mg/dL (ref 8–23)
CO2: 13 mmol/L — ABNORMAL LOW (ref 22–32)
CO2: 14 mmol/L — ABNORMAL LOW (ref 22–32)
Calcium: 7.7 mg/dL — ABNORMAL LOW (ref 8.9–10.3)
Calcium: 6.9 mg/dL — ABNORMAL LOW (ref 8.9–10.3)
Chloride: 111 mmol/L (ref 98–111)
Chloride: 113 mmol/L — ABNORMAL HIGH (ref 98–111)
Creatinine, Ser: 2.42 mg/dL — ABNORMAL HIGH (ref 0.61–1.24)
Creatinine, Ser: 2.83 mg/dL — ABNORMAL HIGH (ref 0.61–1.24)
GFR calc Af Amer: 31 mL/min — ABNORMAL LOW (ref >60)
GFR calc Af Amer: 26 mL/min — ABNORMAL LOW (ref >60)
GFR calc non Af Amer: 27 mL/min — ABNORMAL LOW (ref >60)
GFR calc non Af Amer: 22 mL/min — ABNORMAL LOW (ref >60)
Glucose, Bld: 467 mg/dL — ABNORMAL HIGH (ref 70–99)
Glucose, Bld: 225 mg/dL — ABNORMAL HIGH (ref 70–99)
Potassium: 3.2 mmol/L — ABNORMAL LOW (ref 3.5–5.1)
Potassium: 2.5 mmol/L — CL (ref 3.5–5.1)
Sodium: 139 mmol/L (ref 135–145)
Sodium: 143 mmol/L (ref 135–145)
Total Bilirubin: 0.3 mg/dL (ref 0.3–1.2)
Total Bilirubin: 0.2 mg/dL — ABNORMAL LOW (ref 0.3–1.2)
Total Protein: 5.6 g/dL — ABNORMAL LOW (ref 6.5–8.1)
Total Protein: 4.3 g/dL — ABNORMAL LOW (ref 6.5–8.1)

## 2018-11-25 LAB — MAGNESIUM
Magnesium: 1.6 mg/dL — ABNORMAL LOW (ref 1.7–2.4)
Magnesium: 1.2 mg/dL — ABNORMAL LOW (ref 1.7–2.4)
Magnesium: 1.2 mg/dL — ABNORMAL LOW (ref 1.7–2.4)

## 2018-11-25 LAB — PROTIME-INR
INR: 1.3 — ABNORMAL HIGH (ref 0.8–1.2)
INR: 1.1 (ref 0.8–1.2)
Prothrombin Time: 15.8 seconds — ABNORMAL HIGH (ref 11.4–15.2)
Prothrombin Time: 14.5 seconds (ref 11.4–15.2)

## 2018-11-25 LAB — ECHOCARDIOGRAM COMPLETE
Height: 72 in
Weight: 4000 oz

## 2018-11-25 LAB — HEPARIN LEVEL (UNFRACTIONATED): Heparin Unfractionated: 0.72 IU/mL — ABNORMAL HIGH (ref 0.30–0.70)

## 2018-11-25 LAB — HEPATIC FUNCTION PANEL
ALT: 106 U/L — ABNORMAL HIGH (ref 0–44)
AST: 229 U/L — ABNORMAL HIGH (ref 15–41)
Albumin: 2 g/dL — ABNORMAL LOW (ref 3.5–5.0)
Alkaline Phosphatase: 41 U/L (ref 38–126)
Bilirubin, Direct: <0.1 mg/dL (ref 0.0–0.2)
Total Bilirubin: 0.3 mg/dL (ref 0.3–1.2)
Total Protein: 3.9 g/dL — ABNORMAL LOW (ref 6.5–8.1)

## 2018-11-25 LAB — MRSA PCR SCREENING: MRSA by PCR: NEGATIVE

## 2018-11-25 LAB — LIPID PANEL
Cholesterol: 122 mg/dL (ref 0–200)
HDL: 79 mg/dL (ref >40)
LDL Cholesterol: 35 mg/dL (ref 0–99)
Total CHOL/HDL Ratio: 1.5 RATIO
Triglycerides: 39 mg/dL (ref <150)
VLDL: 8 mg/dL (ref 0–40)

## 2018-11-25 LAB — PATHOLOGIST SMEAR REVIEW

## 2018-11-25 LAB — APTT: aPTT: 34 seconds (ref 24–36)

## 2018-11-25 LAB — LIPASE, BLOOD: Lipase: 39 U/L (ref 11–51)

## 2018-11-25 LAB — PHOSPHORUS
Phosphorus: 2.6 mg/dL (ref 2.5–4.6)
Phosphorus: <1 mg/dL — CL (ref 2.5–4.6)

## 2018-11-25 LAB — LACTIC ACID, PLASMA
Lactic Acid, Venous: 10.6 mmol/L (ref 0.5–1.9)
Lactic Acid, Venous: 4 mmol/L (ref 0.5–1.9)
Lactic Acid, Venous: 4.2 mmol/L (ref 0.5–1.9)
Lactic Acid, Venous: 8.3 mmol/L (ref 0.5–1.9)

## 2018-11-25 MED ORDER — POTASSIUM CHLORIDE 10 MEQ/100ML IV SOLN: 10.0000 meq | INTRAVENOUS | Status: DC

## 2018-11-25 MED ORDER — PHENYLEPHRINE HCL-NACL 10-0.9 MG/250ML-% IV SOLN
0.0000 ug/min | INTRAVENOUS | Status: DC
  Administered 2018-11-25: 21:00:00 60 ug/min via INTRAVENOUS
  Administered 2018-11-25: 30 ug/min via INTRAVENOUS
  Administered 2018-11-25: 90 ug/min via INTRAVENOUS
  Administered 2018-11-26: 250 ug/min via INTRAVENOUS
  Administered 2018-11-26: 110 ug/min via INTRAVENOUS
  Administered 2018-11-26: 150 ug/min via INTRAVENOUS
  Administered 2018-11-26: 400 ug/min via INTRAVENOUS
  Administered 2018-11-26: 330 ug/min via INTRAVENOUS
  Administered 2018-11-26: 380 ug/min via INTRAVENOUS
  Filled 2018-11-25 (×3): qty 250
  Filled 2018-11-25: qty 500
  Filled 2018-11-25: qty 250
  Filled 2018-11-25: qty 500
  Filled 2018-11-25: qty 250
  Filled 2018-11-25: qty 500
  Filled 2018-11-25 (×3): qty 250

## 2018-11-25 MED ORDER — MAGNESIUM SULFATE IN D5W 1-5 GM/100ML-% IV SOLN
1.0000 g | Freq: Once | INTRAVENOUS | Status: AC
  Administered 2018-11-25: 1 g via INTRAVENOUS
  Filled 2018-11-25: qty 100

## 2018-11-25 MED ORDER — HEPARIN BOLUS VIA INFUSION
4000.0000 [IU] | Freq: Once | INTRAVENOUS | Status: AC
  Administered 2018-11-25: 4000 [IU] via INTRAVENOUS
  Filled 2018-11-25: qty 4000

## 2018-11-25 MED ORDER — DEXTROSE 10 % IV SOLN: INTRAVENOUS | Status: DC | PRN

## 2018-11-25 MED ORDER — ASPIRIN 81 MG PO CHEW: 81.0000 mg | CHEWABLE_TABLET | Freq: Every day | ORAL | Status: DC

## 2018-11-25 MED ORDER — INSULIN ASPART 100 UNIT/ML ~~LOC~~ SOLN
1.0000 [IU] | SUBCUTANEOUS | Status: DC
  Administered 2018-11-25: 21:00:00 2 [IU] via SUBCUTANEOUS
  Administered 2018-11-26 (×2): 1 [IU] via SUBCUTANEOUS

## 2018-11-25 MED ORDER — HEPARIN (PORCINE) 25000 UT/250ML-% IV SOLN
1150.0000 [IU]/h | INTRAVENOUS | Status: DC
  Administered 2018-11-25: 13:00:00 1200 [IU]/h via INTRAVENOUS
  Administered 2018-11-26: 1150 [IU]/h via INTRAVENOUS
  Filled 2018-11-25 (×2): qty 250

## 2018-11-25 MED ORDER — LACTATED RINGERS IV BOLUS
1000.0000 mL | Freq: Once | INTRAVENOUS | Status: AC
  Administered 2018-11-25: 16:00:00 1000 mL via INTRAVENOUS

## 2018-11-25 MED ORDER — INSULIN DETEMIR 100 UNIT/ML ~~LOC~~ SOLN
5.0000 [IU] | Freq: Two times a day (BID) | SUBCUTANEOUS | Status: DC
  Administered 2018-11-25: 5 [IU] via SUBCUTANEOUS
  Filled 2018-11-25 (×3): qty 0.05

## 2018-11-25 MED ORDER — VITAL HIGH PROTEIN PO LIQD
1000.0000 mL | ORAL | Status: DC
  Administered 2018-11-25: 14:00:00 1000 mL

## 2018-11-25 MED ORDER — NOREPINEPHRINE 16 MG/250ML-% IV SOLN
0.0000 ug/min | INTRAVENOUS | Status: DC
  Administered 2018-11-25: 01:00:00 37 ug/min via INTRAVENOUS
  Administered 2018-11-25: 70 ug/min via INTRAVENOUS
  Administered 2018-11-25: 36 ug/min via INTRAVENOUS
  Administered 2018-11-26 (×2): 40 ug/min via INTRAVENOUS
  Filled 2018-11-25 (×6): qty 250

## 2018-11-25 MED ORDER — VASOPRESSIN 20 UNIT/ML IV SOLN
0.0300 [IU]/min | INTRAVENOUS | Status: DC
  Administered 2018-11-25: 0.03 [IU]/min via INTRAVENOUS
  Filled 2018-11-25 (×3): qty 2

## 2018-11-25 MED ORDER — POTASSIUM CHLORIDE 10 MEQ/50ML IV SOLN
10.0000 meq | INTRAVENOUS | Status: AC
  Administered 2018-11-25 – 2018-11-26 (×3): 10 meq via INTRAVENOUS
  Filled 2018-11-25 (×3): qty 50

## 2018-11-25 MED ORDER — PRO-STAT SUGAR FREE PO LIQD
60.0000 mL | Freq: Two times a day (BID) | ORAL | Status: DC
  Administered 2018-11-25 (×2): 60 mL
  Filled 2018-11-25 (×2): qty 60

## 2018-11-25 MED ORDER — INSULIN ASPART 100 UNIT/ML ~~LOC~~ SOLN
1.0000 [IU] | SUBCUTANEOUS | Status: DC
  Administered 2018-11-26 (×2): 1 [IU] via SUBCUTANEOUS

## 2018-11-25 MED ORDER — SODIUM CHLORIDE 0.9 % IV SOLN
3.0000 g | Freq: Four times a day (QID) | INTRAVENOUS | Status: DC
  Administered 2018-11-25 – 2018-11-26 (×3): 3 g via INTRAVENOUS
  Filled 2018-11-25 (×2): qty 3
  Filled 2018-11-25 (×4): qty 8
  Filled 2018-11-25: qty 3

## 2018-11-25 MED ORDER — POTASSIUM CHLORIDE 10 MEQ/50ML IV SOLN
10.0000 meq | INTRAVENOUS | Status: AC
  Administered 2018-11-25 (×5): 10 meq via INTRAVENOUS
  Filled 2018-11-25 (×5): qty 50

## 2018-11-25 MED ORDER — SODIUM CHLORIDE 0.9 % IV BOLUS
1000.0000 mL | Freq: Once | INTRAVENOUS | Status: AC
  Administered 2018-11-25: 1000 mL via INTRAVENOUS

## 2018-11-25 MED ORDER — CALCIUM GLUCONATE-NACL 1-0.675 GM/50ML-% IV SOLN
1.0000 g | Freq: Once | INTRAVENOUS | Status: AC
  Administered 2018-11-25: 09:00:00 1000 mg via INTRAVENOUS
  Filled 2018-11-25: qty 50

## 2018-11-25 MED ORDER — PANTOPRAZOLE SODIUM 40 MG PO PACK
40.0000 mg | PACK | Freq: Every day | ORAL | Status: DC
  Administered 2018-11-25: 40 mg
  Filled 2018-11-25 (×2): qty 20

## 2018-11-25 MED ORDER — SODIUM BICARBONATE 8.4 % IV SOLN
100.0000 meq | Freq: Once | INTRAVENOUS | Status: AC
  Administered 2018-11-25: 07:00:00 100 meq via INTRAVENOUS
  Filled 2018-11-25: qty 100

## 2018-11-25 MED ORDER — STERILE WATER FOR INJECTION IV SOLN
INTRAVENOUS | Status: DC
  Administered 2018-11-25 – 2018-11-26 (×3): via INTRAVENOUS
  Filled 2018-11-25 (×5): qty 850

## 2018-11-25 MED FILL — Medication: Qty: 1 | Status: AC

## 2018-11-25 NOTE — Progress Notes (Signed)
Patient suffered a cardiac arrest.  We responded to code.  Patient maxed out on 4 pressors.  CPR x10 minutes with ROSC.  Neuro exam is terrible at this point.  I reviewed head CT with extreme edema and herniation.  We had an extensive discussion with the family.  After a long discussion, decision was made to make patient a full DNR with no further escalation of care and allow family to visit.  The patient is critically ill with multiple organ systems failure and requires high complexity decision making for assessment and support, frequent evaluation and titration of therapies, application of advanced monitoring technologies and extensive interpretation of multiple databases.   Critical Care Time devoted to patient care services described in this note is  69  Minutes. This time reflects time of care of this signee Dr Jennet Maduro. This critical care time does not reflect procedure time, or teaching time or supervisory time of PA/NP/Med student/Med Resident etc but could involve care discussion time.  Rush Farmer, M.D. [facility] Pulmonary/Critical Care Medicine. Pager: [phone]. After hours pager: [phone].

## 2018-11-25 NOTE — Progress Notes (Signed)
Patient transported to CT & back on ventilator without complication.  Miquel Lamson David RRT  

## 2018-11-25 NOTE — Progress Notes (Signed)
Potassium 2.1. Spoke with MD. Instructed to give 3 runs of potassium. RN clarified it was okay to give runs even though pt rewarming.

## 2018-11-25 NOTE — Progress Notes (Signed)
ABG results given to Dr. Kristeen Miss NP. No new orders received at this time.

## 2018-11-25 NOTE — Progress Notes (Signed)
Chaplain paged to 2H-21 due to family receiving bad news. Family is at pt's bedside. Wife, son, and daughter are present. Chaplain provided prayer and contacted the families pastor for a phone visit. Chaplain remains available as needed per family request. Family's spiritual preference is [facility].  Chief Strategy Officer, Evelene Croon, M. Div. Pager # [phone]

## 2018-11-25 NOTE — Progress Notes (Signed)
Responded to Code Blue. The Nurse on duty told me that the family was in the consultation room. The family had been advised that the situation was critical. I then met with the family. Upon family's request I prayed with the family. Medical personnel then came into the consultation room to advise the family of the situation. I escorted the family to the bedside of the patient. Will continue to provide care to the family as needed while present.

## 2018-11-25 NOTE — Progress Notes (Signed)
ANTICOAGULATION CONSULT NOTE - Follow-Up Consult  Pharmacy Consult for heparin Indication: rule out PE  No Known Allergies  Patient Measurements: Height: 6' (182.9 cm) Weight: 250 lb (113.4 kg) IBW/kg (Calculated) : 77.6 Heparin Dosing Weight: 102kg  Vital Signs: Temp: 92.5 F (33.6 C) (08/31 1800) Temp Source: Core (08/31 1800) BP: 186/101 (08/31 1900) Pulse Rate: 111 (08/31 1900)  Labs: Recent Labs    10/28/2018 1243  11/16/2018 1543  10/26/2018 1823  11/09/2018 2323 11/25/18 0431  11/25/18 1611 11/25/18 1650 11/25/18 1653 11/25/18 1722 11/25/18 1806 11/25/18 1807  HGB 14.1   < > 14.3   < >  --    < >  --  14.9   < > 15.0  --  12.2* 13.6  --   --   HCT 45.0   < > 42.4   < >  --    < >  --  46.2   < > 44.0  --  36.0* 40.0  --   --   PLT 160  --  193  --   --   --   --  217  --   --   --   --   --   --   --   APTT  --   --  35  --   --   --  34  --   --   --   --   --   --   --   --   LABPROT 15.1  --  15.2  --   --   --  15.8* 14.5  --   --   --   --   --   --   --   INR 1.2  --  1.2  --   --   --  1.3* 1.1  --   --   --   --   --   --   --   HEPARINUNFRC  --   --   --   --   --   --   --   --   --   --   --   --   --  0.72*  --   CREATININE 1.55*   < > 1.01  --   --    < >  --  2.42*   < >  --  2.59* 2.20*  --   --  2.83*  TROPONINIHS 173*  --  2,978*  --  14,503*  --   --   --   --   --   --   --   --   --   --    < > = values in this interval not displayed.    Estimated Creatinine Clearance: 33.4 mL/min (A) (by C-G formula based on SCr of 2.83 mg/dL (H)).   Medical History: Past Medical History:  Diagnosis Date  . Diabetes type 2, controlled ([place])   . Hyperlipidemia   . Hypertension     Assessment: 89 YOM s/p cardiac arrest with presumed stemi taken urgently to cath lab, found to have clean coronaries. Now post echo with concern for PE. Pharmacy consulted for Heparin dosing.    Heparin level this evening is slightly SUPRAtherapeutic this evening (HL 0.72, goal  of 0.3-0.7) however is expected to start to drop with rewarming. It is noted that the patient started rewarming around 1600 today however this has been paused this evening after the patient arrested. Given  the pause in rewarming - will reduce the rate only slightly and recheck a level in 6 hours.   Goal of Therapy:  Heparin level 0.3-0.7 units/ml Monitor platelets by anticoagulation protocol: Yes   Plan:  - Reduce Heparin to 1150 units/hr (11.5 ml/hr) - Will continue to monitor for any signs/symptoms of bleeding and will follow up with heparin level in 6 hours  Thank you for allowing pharmacy to be a part of this patient's care.  Georgina Pillion, PharmD, BCPS Clinical Pharmacist Clinical phone for 11/25/2018: [phone] 11/25/2018 8:15 PM   **Pharmacist phone directory can now be found on [email or web address] ([email or web address]).  Listed under [facility].

## 2018-11-25 NOTE — Progress Notes (Addendum)
PULMONARY / CRITICAL CARE MEDICINE   NAME:  Charles Reid, MRN:  [id number], DOB:  Feb 23, 1951, LOS: 1 ADMISSION DATE:  10/29/2018, CONSULTATION DATE:  8/30  REFERRING MD:  EDP Tegeler MD  , CHIEF COMPLAINT:  Cardiac Arrest   BRIEF HISTORY:    68 yo male with DM, HTN with witnessed arrest at home with immediate bystander CPR . CPR x 1 hr w/ ROSC . On arrival to ER , elevated troponin and EKG changes, taken to cath lab with non ischemic cardiomyopathy , EF 35-45% . PCCM to admit. TTM started.   HISTORY OF PRESENT ILLNESS   68 year old male minimum smoking history with no medical history of hypertension, hyperlipidemia and diabetes with witnessed cardiac arrest at home with immediate bystander CPR.  Per wife patient was watching online church this morning in usual state of good health.  Wife said she heard a grunting or snoring type sound collapsed while sitting.  Patient's wife is a respiratory therapist.  She activated EMS and started immediate CPR as he was pulseless.  On EMS arrival patient was reported to be in PEA.  CPR was continued.  During CPR patient did have V. fib requiring shock.  He had multiple rounds of CPR lasting around 1 hour total. In the emergency room patient was started on an epi drip and required Levophed for hypotension.  Chest x-ray was essentially clear.  Initial troponin very high at 173.  . lactic acid was elevated at 9.9.  Potassium 3.1.  WBC 14.9 EKG consistent with ischemia.  Patient was taken to the cardiac catheterization found to have a nonischemic cardiomyopathy with clean coronary arteries and mild to moderate LV dysfunction EF 35-45%.  Concerned that his arrest  was most likely arrhythmogenic in nature.   TTM (Ice packs ) started in cath lab . On arrival to ICU , patient unresponsive on no sedation . Appears comfortable on vent . Temp on arrival 34 degrees C . TTM pads started.  B/p improved and weaned off pressors .    SIGNIFICANT PAST MEDICAL HISTORY   Hypertension,  hyperlipidemia, diabetes  SIGNIFICANT EVENTS:  8/30-cardiac arrest, CPR x1 hour with ROSC, taken to the cardiac Cath Lab, TTM started in cath lab  8/31 ECHO concerning for PE. CT head ordered. EEG pending. Weaning pressors. Remains cool but is due to initiation of rewarm at 1500    STUDIES:   8/30 cardiac cath> nonischemic cardiomyopathy, normal coronaries, EF 35 to 45% 8/30 ECHO: LVEF 50-55%, LV hypokinesis without wall motion abnormalities. RV free wall hypokinesis possible McConnells sign 8/31 CXR> bibasilar opacities (suspect atelectasis vs infiltrate), stable cardiomegaly  8/31 EEG 8/31 CT Head  CULTURES:  8/30 SARS CoV2 >Neg  8/30 BC x 2 >  8/31 Trachea aspirate >>  ANTIBIOTICS:  Unasyn 8/31>>   LINES/TUBES:  8/30 ETT >> 8/30 CVC >>   CONSULTANTS:  Cardiology    INTERVAL/SUBJECTIVE:  Overnight patient received CaGluconate, 1L LR, 1L NS, K for hypokalemia, Mag and 2 amps bicarb + a bicarb gtt  Cr worsening overnight, pH 7.194, Lactate 8.3  Remains intubated, sedated, paralyzed, cool. Remains on NE   CONSTITUTIONAL: BP 110/63   Pulse 80   Temp (!) 91.6 F (33.1 C) (Bladder)   Resp 16   Ht 6' (1.829 m)   Wt 113.4 kg   SpO2 94%   BMI 33.91 kg/m   I/O last 3 completed shifts: In: 6397.4 [I.V.:3606.5; IV Piggyback:2790.9] Out: 2755 [Urine:2205; Emesis/NG output:550]  CVP:  [6 mmHg-73 mmHg]  6 mmHg  Vent Mode: PRVC FiO2 (%):  [60 %-100 %] 60 % Set Rate:  [16 bmp] 16 bmp Vt Set:  [640 ZO-1096 mL] 670 mL PEEP:  [5 cmH20-10 cmH20] 5 cmH20 Plateau Pressure:  [20 cmH20-23 cmH20] 20 cmH20  PHYSICAL EXAM:  General: WDWN adult M, intubated, sedated, chemically paralyzed NAD  Neuro: Sedated, paralyzed. 64mm pupils, fixed.  HEENT: NCAT, pink mmm, anicteric sclera, trachea midline, ETT secure  Cardiovascular: RRR s1s2 no rgm  Lungs: Coarse breath sounds bilaterally no wheezing Abdomen: Soft, round, nd. Hypoactive x4  Musculoskeletal: Symmetrical bulk and tone, no  cyanosis, no edema  Skin: Clean, dry, cool, without rash   RESOLVED PROBLEM LIST   ASSESSMENT AND PLAN    Acute hypoxic respiratory failure in setting of cardiac arrest -ETT 8/30 P -Continue mechanical ventilation -VAP, pulm hygiene -Full vent support -PEEP/FiO2 for goal SpO2 > 92% -PAD: midaz/fentanyl.  -AM CXR   Acute encephalopathy -in setting of prolonged CPR, concern for anoxic injury due to total downtime -difficult to assess at this time due to sedation, paralytics P -continue fent/midaz/cis per TTM  -Close neuro assessment during/following rewarming when applicable -EEG ordered for 8/31 -STAT Head CT -- ECHO concerning for PE and would like to initiate heparin gtt however need CT H prior   Cardiac arrest -Total CPR > 1 hr with multiple cycles of arrest/ROSC. Initial PEA arrest, subsequently Vfib arrest. -TTM initiated 8/30 Elevated troponin Hx HTN Shock -s/p L cath 8/30 showing 35-40% LVEF.  -ECHO LVEF 50-55%, LV hypokinesis without wall motion abnormalities. RV free wall hypokinesis possible McConnells sign -CVP has been low, has received multiple fluid boluses  P -Continue TTM 33 degrees. On target to commence re-warming this afternoon -Remains on fent/versed/cis at present  -MAP goal > 65, continue NE for goal. Off epi  -CVP q4hr  -ECHO concerning for possible PE-- need CT Head before we can safely initiate heparin gtt   DM  -insulin gtt   AKI -likely 2/2 hypoperfusion in setting if cardiac arrest  -interval worsening Cr from 1.8 to 2.42 (8/31) AGMA  P -Strict I/O -Continue bicarb gtt. Increasing to 162ml/hr (8/31) based on last ABG  -Trend renal function via metabolic panels  -MAP goal > 65 as above for renal perfusion  Transaminitis -likely in setting of hypoperfusion P -Trend LFTs  -Supportive care as above  Leukocytosis -interval increase in bibasilar opacities -WBC remains elevated -reactive vs early infective, aspiration P -starting  empiric unasyn for possible aspiration PNA -send tracheal aspirate -Trend WBC  Hypomagnesemia Hypokalemia Hypocalcemia P -Replace as needed-- CaGluconate now. Mag and K replaced overnight  -Follow up BMP mag/phos this afternoon   Malnutrition, at risk P -Defer initiation of EN at this time, anticipate trickle feeds when rewarmed   SUMMARY OF TODAY'S PLAN:  -Increase bicarb gtt -Trend metabolic panel for renal function and electrolyte replacement  -Starting Unasyn empirically -NE for MAP goal > 65 -Anticipate rewarming to start this afternoon -EEG   Best Practice / Goals of Care / Disposition.   DVT PROPHYLAXIS: SQ Heparin SUP:PPI  NUTRITION: NPO  MOBILITY:BR  GOALS OF CARE: Full Code  FAMILY DISCUSSIONS: Wife updated at bedside  DISPOSITION ICU   LABS  Glucose Recent Labs  Lab 11/10/2018 2359 11/25/18 0114 11/25/18 0156 11/25/18 0310 11/25/18 0415 11/25/18 0534  GLUCAP 429* 421* 407* 350* 426* 428*    BMET Recent Labs  Lab 11/03/2018 1243  10/26/2018 1543  11/15/2018 1954 11/08/2018 2203 11/25/18 0431 11/25/18 0454  NA 141   < > 143   < > 141 141 139 140  K 3.1*   < > 3.2*   < > 3.8 4.2 3.2* 5.1  CL 103   < > 121*   < > 104 105 111  --   CO2 19*  --  9*  --   --   --  13*  --   BUN 13   < > 14   < > 23 26* 24*  --   CREATININE 1.55*   < > 1.01   < > 1.80* 1.80* 2.42*  --   GLUCOSE 405*   < > 272*   < > 481* 498* 467*  --    < > = values in this interval not displayed.    Liver Enzymes Recent Labs  Lab 03-07-2019 1243 11/25/18 0431  AST 374* 527*  ALT 161* 182*  ALKPHOS 57 53  BILITOT 0.6 0.3  ALBUMIN 3.0* 3.0*    Electrolytes Recent Labs  Lab 03-07-2019 1243 03-07-2019 1543 11/25/18 0431  CALCIUM 8.1* 4.5* 7.7*  MG 2.1  --  1.6*  PHOS  --   --  2.6    CBC Recent Labs  Lab 03-07-2019 1243  03-07-2019 1543  03-07-2019 2203 11/25/18 0431 11/25/18 0537  WBC 14.9*  --  17.4*  --   --  16.3*  --   HGB 14.1   < > 14.3   < > 15.3 14.9 15.3  HCT 45.0    < > 42.4   < > 45.0 46.2 45.0  PLT 160  --  193  --   --  217  --    < > = values in this interval not displayed.    ABG Recent Labs  Lab 03-07-2019 1644 03-07-2019 1815 11/25/18 0537  PHART 7.333* 7.227* 7.194*  PCO2ART 37.5 31.6* 38.4  PO2ART 55.0* 60.0* 102.0    Coag's Recent Labs  Lab 03-07-2019 1543 03-07-2019 2323 11/25/18 0431  APTT 35 34  --   INR 1.2 1.3* 1.1    Sepsis Markers Recent Labs  Lab 03-07-2019 1543 03-07-2019 1823 11/25/18 0431  LATICACIDVEN 6.8* 8.9* 8.3*    Cardiac Enzymes No results for input(s): TROPONINI, PROBNP in the last 168 hours.     CRITICAL CARE Performed by: Lanier ClamGrace E Daiana Vitiello   Total critical care time: 60 minutes  Critical care time was exclusive of separately billable procedures and treating other patients.  Critical care was necessary to treat or prevent imminent or life-threatening deterioration.  Critical care was time spent personally by me on the following activities: development of treatment plan with patient and/or surrogate as well as nursing, discussions with consultants, evaluation of patient's response to treatment, examination of patient, obtaining history from patient or surrogate, ordering and performing treatments and interventions, ordering and review of laboratory studies, ordering and review of radiographic studies, pulse oximetry and re-evaluation of patient's condition.   Tessie FassGrace Evett Kassa MSN, AGACNP-BC [facility] [phone] If no answer, [phone] 11/25/2018, 8:53 AM

## 2018-11-25 NOTE — Progress Notes (Addendum)
eLink Physician-Brief Progress Note Patient Name: Charles Reid DOB: 01/08/1951 MRN: [id number]   Date of Service  11/25/2018  HPI/Events of Note  Discussed with bedside RN that the patient coded while being rewarmed.  Family agreed to make the patient DNR.  The patient is also insulin gtt with glucose at 172.   eICU Interventions  Advised to complete rewarming.  Check glucose q4hours.  Insulin gtt was only running at 1 unit.  Sliding scale insulin as needed.     Intervention Category Evaluation Type: Other  [name] 11/25/2018, 8:26 PM   9:15 PM  Notified of K 2.5, crea 2.84.  Plan> Pls give 26meq KCl x 3. Continue to follow electrolytes.  2:28 AM  Informed of abnormal electrolytes.   Plan> Replete K and Ca.  Pt continues to have increasing pressor requirements. Family at the bedside.  5:57 AM Notified of abnormal electrolytes.  Plan> Replete K, Mg and Calcium.

## 2018-11-25 NOTE — Progress Notes (Signed)
ANTICOAGULATION CONSULT NOTE - Initial Consult  Pharmacy Consult for heparin Indication: rule out PE  No Known Allergies  Patient Measurements: Height: 6' (182.9 cm) Weight: 250 lb (113.4 kg) IBW/kg (Calculated) : 77.6 Heparin Dosing Weight: 102kg  Vital Signs: Temp: 91.8 F (33.2 C) (08/31 1030) Temp Source: Core (08/31 1030) BP: 102/62 (08/31 1030) Pulse Rate: 80 (08/31 0945)  Labs: Recent Labs    11/21/2018 1243  11/07/2018 1543  11/23/2018 1823  11/03/2018 1954 11/23/2018 2203 11/12/2018 2323 11/25/18 0431 11/25/18 0537 11/25/18 0848  HGB 14.1   < > 14.3   < >  --    < > 15.6 15.3  --  14.9 15.3 15.0  HCT 45.0   < > 42.4   < >  --    < > 46.0 45.0  --  46.2 45.0 44.0  PLT 160  --  193  --   --   --   --   --   --  217  --   --   APTT  --   --  35  --   --   --   --   --  34  --   --   --   LABPROT 15.1  --  15.2  --   --   --   --   --  15.8* 14.5  --   --   INR 1.2  --  1.2  --   --   --   --   --  1.3* 1.1  --   --   CREATININE 1.55*   < > 1.01  --   --    < > 1.80* 1.80*  --  2.42*  --   --   TROPONINIHS 173*  --  2,978*  --  14,503*  --   --   --   --   --   --   --    < > = values in this interval not displayed.    Estimated Creatinine Clearance: 39 mL/min (A) (by C-G formula based on SCr of 2.42 mg/dL (H)).   Medical History: Past Medical History:  Diagnosis Date  . Diabetes type 2, controlled ([place])   . Hyperlipidemia   . Hypertension     Assessment: 68 year old male s/p cardiac arrest with presumed stemi taken urgently to cath lab, found to have clean coronaries. Now post echo with concern for PE, new orders to start IV heparin.   Will start heparin at slightly low rate than normal given that patient will still be on cooling protocol for several more hours. Will check level this afternoon and will follow levels closely while rewarming.   Goal of Therapy:  Heparin level 0.3-0.7 units/ml Monitor platelets by anticoagulation protocol: Yes   Plan:  Give 4000  units bolus x 1 Start heparin infusion at 1200 units/hr Check anti-Xa level in 6 hours and daily while on heparin Continue to monitor H&H and platelets  Erin Hearing PharmD., BCPS Clinical Pharmacist 11/25/2018 11:37 AM

## 2018-11-25 NOTE — Progress Notes (Signed)
PCCM Progress note  I was called as patient went into cardiac arrest Check code note for further details ROSC in 10 minutes Discussion with Dr. Nelda Marseille, myself with patient and children in conference room Decision made to change code status to full DNR.  Marshell Garfinkel MD [facility] 11/25/2018, 6:27 PM

## 2018-11-25 NOTE — Progress Notes (Signed)
CRITICAL VALUE ALERT  Critical Value:  ABG pH 7.194  Date & Time Notied:  11/25/2018 0620  Provider Notified: Dr. Boone Master, MD.  Orders Received/Actions taken: Refer to orders. 2 amps Bicarb to be administered and increase rate of Bicarb gtt to 100 mL/hr.

## 2018-11-25 NOTE — Progress Notes (Addendum)
Initial Nutrition Assessment  DOCUMENTATION CODES:   Not applicable  INTERVENTION:   Tube feeding:  -Vital High Protein @ 20 ml/hr via OGT -Increase by 10 ml Q8 hours to goal rate of 50 ml/hr (1200 ml) -60 ml Prostat BID  At goat TF provides: 1600  kcals, 165 grams protein, 1003 ml free water. Meets 101% kcal needs and 100% protein needs.   NUTRITION DIAGNOSIS:   Inadequate oral intake related to inability to eat as evidenced by NPO status.  GOAL:   Patient will meet greater than or equal to 90% of their needs  MONITOR:   Diet advancement, Vent status, Skin, TF tolerance, Weight trends, Labs, I & O's  REASON FOR ASSESSMENT:   Ventilator    ASSESSMENT:   Patient with PMH significant for HTN, HLD, and DM. Presents this admission s/p cardiac arrest.    8/30- s/p L heart cath  RD working remotely.  Pt on TTM- rewarming to start this afternoon. Concern for anoxic brain injury given prolonged downtime. Neuro to see. Pressors being weaned. CBGs elevated. Will discuss feeding with CCM.   Addendum: Spoke with CCM, okay to start trickles.   Weight limited in chart. Will utilize EDW of 113.4 kg to estimate needs.   Patient is currently intubated on ventilator support MV: 10.7 L/min Temp (24hrs), Avg:91.4 F (33 C), Min:90.3 F (32.4 C), Max:93.8 F (34.3 C)   I/O: +4,643 ml since admit UOP: 2,865 ml x 24 hrs  OGT: 550 ml x 24 hrs    Drips: NS @ 10 ml/hr, nimbex, insulin, levophed, sodium bicarb Labs: K 3.2 (L) Mg 1.6 (L) LFTs elevated CBGs 190-498 Cr 2.42- trending up  Diet Order:   Diet Order            Diet NPO time specified  Diet effective now              EDUCATION NEEDS:   Not appropriate for education at this time  Skin:  Skin Assessment: Reviewed RN Assessment  Last BM:  8/30  Height:   Ht Readings from Last 1 Encounters:  11/25/18 6' (1.829 m)    Weight:   Wt Readings from Last 1 Encounters:  11/25/18 113.4 kg    Ideal Body  Weight:  80.9 kg  BMI:  Body mass index is 33.91 kg/m.  Estimated Nutritional Needs:   Kcal:  1247- 1588 kcal  Protein:  162-182 grams  Fluid:  >/= 1.5 L/day   Mariana Single RD, LDN Clinical Nutrition Pager # - [phone]

## 2018-11-25 NOTE — Procedures (Signed)
Patient Name: Jeanette Moffatt  MRN: [id number]  Epilepsy Attending: Lora Havens  Referring Physician/Provider: Rexene Edison, NP Date: 11/25/2018 Duration: 22.25mins  Patient history: 69 year old male who presented with cardiac arrest.  EEG to evaluate for seizures.  Level of alertness: Comatose  AEDs during EEG study: Versed  Technical aspects: This EEG study was done with scalp electrodes positioned according to the 10-20 International system of electrode placement. Electrical activity was acquired at a sampling rate of 500Hz  and reviewed with a high frequency filter of 70Hz  and a low frequency filter of 1Hz . EEG data were recorded continuously and digitally stored.   Description: EEG showed continuous generalized background suppression.  EEG was not reactive to tactile stimuli. Hyperventilation and photic stimulation were not performed.  IMPRESSION: This study is suggestive of profound diffuse encephalopathy.  This could be an effect of medications as well as diffuse anoxic/hypoxic injury. No seizures or epileptiform discharges were seen throughout the recording.  Kayne Yuhas Barbra Sarks

## 2018-11-25 NOTE — Progress Notes (Signed)
CRITICAL VALUE ALERT  Critical Value: K+ 2.5  Date & Time Notied:  2115  Provider Notified: Dr. James Ivanoff  Orders Received/Actions taken: additional 3 runs of K+ ordered IV

## 2018-11-25 NOTE — Progress Notes (Addendum)
At approximately 1746 the pt went into torsades followed by v-fib. Compressions began immediately. Rosc achieved after 10 minutes.

## 2018-11-25 NOTE — Progress Notes (Signed)
Dr. Vaughan Browner notified of low SpO2 and being bagged event. Verbal order received to maintain settings of 670/70%/+12 and to increase RR to 24. Nursing notified of changes. RT will continue to monitor.

## 2018-11-25 NOTE — Progress Notes (Signed)
EEG complete - results pending 

## 2018-11-25 NOTE — Procedures (Signed)
PEA arrest, CPR x10 minutes, bicarb, epi and atropine given  ROS established.  Rush Farmer, M.D. [facility]monary/Critical Care Medicine. Pager: [phone]. After hours pager: [phone].

## 2018-11-25 NOTE — Progress Notes (Signed)
Progress Note  Patient Name: Charles Reid Date of Encounter: 11/25/2018  Primary Cardiologist: Dr. Quay Burow  Subjective   Postop day #1 s/p witnessed cardiac arrest with immediate bystander CPR, intubated, sedated and paralyzed on a ventilator being systemically cooled.  Inpatient Medications    Scheduled Meds: . artificial tears  1 application Both Eyes O8N  . aspirin  81 mg Per Tube Daily  . chlorhexidine gluconate (MEDLINE KIT)  15 mL Mouth Rinse BID  . Chlorhexidine Gluconate Cloth  6 each Topical Daily  . fentaNYL (SUBLIMAZE) injection  50 mcg Intravenous Once  . heparin  5,000 Units Subcutaneous Q8H  . mouth rinse  15 mL Mouth Rinse 10 times per day  . midazolam  1 mg Intravenous Once  . pantoprazole sodium  40 mg Per Tube QHS  . sodium chloride flush  3 mL Intravenous Q12H   Continuous Infusions: . sodium chloride    . sodium chloride Stopped (11/25/18 0659)  . sodium chloride 10 mL/hr at 11/25/18 0800  . calcium gluconate    . cisatracurium (NIMBEX) infusion 1 mcg/kg/min (11/25/18 0800)  . epinephrine Stopped (11/25/18 0756)  . fentaNYL infusion INTRAVENOUS 150 mcg/hr (11/25/18 0800)  . insulin 9.5 mL/hr at 11/25/18 0800  . midazolam 2 mg/hr (11/25/18 0800)  . norepinephrine (LEVOPHED) Adult infusion 36 mcg/min (11/25/18 0830)  .  sodium bicarbonate (isotonic) infusion in sterile water 100 mL/hr at 11/25/18 0800   PRN Meds: Place/Maintain arterial line **AND** sodium chloride, sodium chloride, acetaminophen, [COMPLETED] cisatracurium **AND** cisatracurium (NIMBEX) infusion **AND** cisatracurium, midazolam, ondansetron (ZOFRAN) IV, sodium chloride flush   Vital Signs    Vitals:   11/25/18 0721 11/25/18 0730 11/25/18 0745 11/25/18 0800  BP: 124/61 108/63 110/63   Pulse: 81 83 80   Resp: _0 Temp:    (!) 91 F (32.8 C)  TempSrc:    Core  SpO2: 97% 95% 94%   Weight:      Height:        Intake/Output Summary (Last 24 hours) at 11/25/2018 0830  Last data filed at 11/25/2018 0800 Gross per 24 hour  Intake 7932.48 ml  Output 3440 ml  Net 4492.48 ml   Last 3 Weights 11/01/2018  Weight (lbs) 250 lb  Weight (kg) 113.399 kg      Telemetry    Sinus rhythm with PVCs- Personally Reviewed  ECG    Normal sinus rhythm 86 with left axis deviation- Personally Reviewed  Physical Exam   GEN: No acute distress.   Neck: No JVD Cardiac: RRR, no murmurs, rubs, or gallops.  Respiratory: Clear to auscultation bilaterally. GI: Soft, nontender, non-distended  MS: No edema; No deformity. Neuro:  Nonfocal sedated and paralyzed Psych: Normal affect   Labs    High Sensitivity Troponin:   Recent Labs  Lab 11/13/2018 1243 11/10/2018 1543 11/17/2018 1823  TROPONINIHS 173* 2,978* 14,503*      Chemistry Recent Labs  Lab 11/16/2018 1243  11/21/2018 1543  11/16/2018 1954 10/31/2018 2203 11/25/18 0431 11/25/18 0537  NA 141   < > 143   < > 141 141 139 140  K 3.1*   < > 3.2*   < > 3.8 4.2 3.2* 5.1  CL 103   < > 121*   < > 104 105 111  --   CO2 19*  --  9*  --   --   --  13*  --   GLUCOSE 405*   < > 272*   < >  481* 498* 467*  --   BUN 13   < > 14   < > 23 26* 24*  --   CREATININE 1.55*   < > 1.01   < > 1.80* 1.80* 2.42*  --   CALCIUM 8.1*  --  4.5*  --   --   --  7.7*  --   PROT 5.4*  --   --   --   --   --  5.6*  --   ALBUMIN 3.0*  --   --   --   --   --  3.0*  --   AST 374*  --   --   --   --   --  527*  --   ALT 161*  --   --   --   --   --  182*  --   ALKPHOS 57  --   --   --   --   --  53  --   BILITOT 0.6  --   --   --   --   --  0.3  --   GFRNONAA 46*  --  >60  --   --   --  27*  --   GFRAA 53*  --  >60  --   --   --  31*  --   ANIONGAP 19*  --  13  --   --   --  15  --    < > = values in this interval not displayed.     Hematology Recent Labs  Lab 11/08/2018 1243  11/16/2018 1543  11/23/2018 2203 11/25/18 0431 11/25/18 0537  WBC 14.9*  --  17.4*  --   --  16.3*  --   RBC 4.10*  --  4.12*  --   --  4.38  --   HGB 14.1   < > 14.3    < > 15.3 14.9 15.3  HCT 45.0   < > 42.4   < > 45.0 46.2 45.0  MCV 109.8*  --  102.9*  --   --  105.5*  --   MCH 34.4*  --  34.7*  --   --  34.0  --   MCHC 31.3  --  33.7  --   --  32.3  --   RDW 12.5  --  12.7  --   --  12.7  --   PLT 160  --  193  --   --  217  --    < > = values in this interval not displayed.    BNP Recent Labs  Lab 11/05/2018 1243  BNP 123.6*     DDimer No results for input(s): DDIMER in the last 168 hours.   Radiology    Dg Chest Port 1 View  Result Date: 11/25/2018 CLINICAL DATA:  Intubation.  Cardiac arrest. EXAM: PORTABLE CHEST 1 VIEW COMPARISON:  11/16/2018. FINDINGS: Endotracheal tube, NG tube, left subclavian line stable position. Stable cardiomegaly. Mild bibasilar atelectasis/infiltrates, slightly progressed from prior exam. No pleural effusion or pneumothorax. IMPRESSION: 1.  Lines and tubes stable position. 2.  Stable cardiomegaly. 3. Mild bibasilar atelectasis/infiltrates, slightly progressed from prior exam. Electronically Signed   By: [place]   On: 11/25/2018 06:54   Dg Chest Port 1 View  Result Date: 11/12/2018 CLINICAL DATA:  Central line placement EXAM: PORTABLE CHEST 1 VIEW COMPARISON:  Chest radiograph from earlier today. FINDINGS: Endotracheal tube tip is 2.9 cm  above the carina. Enteric tube enters stomach with the tip not seen on this image. Left subclavian central venous catheter terminates in the middle third of the SVC. Stable cardiomediastinal silhouette with top-normal heart size. No pneumothorax. No pleural effusion. New parahilar fluffy and linear opacities. Low lung volumes. IMPRESSION: 1. No pneumothorax.  Well-positioned support structures. 2. New parahilar fluffy and linear opacities compatible with pulmonary edema. Low lung volumes. Electronically Signed   By: Ilona Sorrel M.D.   On: 10/28/2018 16:20   Dg Chest Portable 1 View  Result Date: 10/29/2018 CLINICAL DATA:  Cardiac arrest EXAM: PORTABLE CHEST 1 VIEW COMPARISON:   None. FINDINGS: Endotracheal tube tip is 5.1 cm above the carina. Enteric tube enters stomach with the tip not seen on this image. Pacer pad overlies the lateral lower left chest. Normal cardiac and mediastinal silhouette. No pneumothorax. No pleural effusion. Slightly low lung volumes. No pulmonary edema. No acute consolidative airspace disease. IMPRESSION: Well-positioned support structures. Slightly low lung volumes with no active cardiopulmonary disease. Electronically Signed   By: Ilona Sorrel M.D.   On: 11/17/2018 13:28    Cardiac Studies   Cardiac catheterization (10/26/2018)  IMPRESSION: Mr. Climer has a nonischemic cardiomyopathy with clean coronary arteries and mild to moderate LV dysfunction.  I believe his arrest was probably arrhythmogenic in nature.  The major issue is whether or not he will regain neurologic function.  PCCM will admit.  He will be systemically cooled under the "Arctic sun" protocol.  A 2D echo was ordered.  We will follow along as consultants.  Patient Profile     68 y.o. married African-American male who had witnessed cardiac arrest, immediate bystander CPR by his wife with respiratory therapist, transferred to [facility] where he was brought urgently to the Cath Lab for angiography revealing essentially normal coronary arteries and moderately severe LV function.  He is sedated, and paralyzed pharmacologically on systemic cooling protocol.  Assessment & Plan    1: Sudden cardiac death- witnessed sudden cardiac death with immediate bystander CPR by his wife, and initial rhythm of PEA followed by shockable rhythm (VF) with an hour of CPR with ROSC of 60 minutes with intermittent return of rhythm throughout.  He has normal coronary arteries and moderate LV dysfunction.  I suspect this was primarily arrhythmogenic.  He continues to be lactic acidotic.  2D echo is pending.  Survival will be driven by return of neurologic function.  2: Lactic acidosis-serum lactic  acid was in the 8 range which is higher than I would have expected.  Probably related to low cardiac output.  2D echo is pending.  He has received bicarbonate intravenously.  3: Electrolyte abnormalities- he has received potassium repletion as well as magnesium  We will remain on the sidelines and until he regains neurologic function.  If so he may be a candidate for ICD implantation.  Await results of 2D echocardiogram.      For questions or updates, please contact [place] Please consult [email or web address] for contact info under        Signed, Quay Burow, MD  11/25/2018, 8:30 AM

## 2018-11-25 NOTE — Progress Notes (Signed)
eLink Physician-Brief Progress Note Patient Name: Charles Reid DOB: October 11, 1950 MRN: [id number]   Date of Service  11/25/2018  HPI/Events of Note  Multiple issues: 1. Lactic Acid = 8.3 - Hgb = 15.3 and CVP = 6, 2. K+ = 3.2 (recent K+ = 5.1), Mg++ = 1.6 and Creatinine = 2.42.   eICU Interventions  Will order: 1. Bolus with 0.9 NaCl 1 liter IV over 1 hour now.  2. Replace Mg++.  3. Change D5 NaHCO3 IV infusion to NaHCO3 IV infusion is sterile water.      Intervention Category Major Interventions: Electrolyte abnormality - evaluation and management;Acid-Base disturbance - evaluation and management  Judye Lorino Eugene 11/25/2018, 6:26 AM

## 2018-11-25 NOTE — Progress Notes (Signed)
PCCM Brief Interval Note  68 yo Man s/p cardiac arrest downtime 60 min. TTM 33 degrees F initiated 8/30. 8/31 ECHO result with findings concerning for acute PE.  -Discussed with PCCM attending-- decision to initiate heparin after first obtaining CT Head non-con  8/31 Head CT non-con  Extensive supratentorial edema, infratentorial edema. Loss of cortical differentiation consistent with diffuse anoxic injury. Cerebral edema effaces sulci, basal cisterns, fourth ventricle with downward herniation of brainstem and cerebellum into foramen magnum. Personally reviewed.  These results were additionally relayed to myself and Dr. Vaughan Browner as critical result by Dr. Jobe Igo, radiology.   8/31 EEG- diffuse encephalopathy: medications vs anoxia  Exam 32.9 degrees F, RR 16, HR 86, BP 111/70 on NE. SpO2 94% Gen-critically ill appearing adult M, intubated, sedated, chemically paralyzed CV-RRR s1s2 no rgm Resp-Mechanical vent sounds appreciated. Diminished bibasilar sounds MSK-Cool to touch, symmetrical bulk and tone Neuro-chemically paralyzed. pupils are fixed and 82mm   Concern for Anoxic Injury in setting of cardiac arrest  CT Head and EEG as above P -Discussed findings with wife, two adult children at bedside. Family is understandably in shock at this news.  -Family would like continued aggressive care at this time -Plan will be to begin rewarming this afternoon  -Heparin per pharmacy for possible PE -Will need neuro consult in future, likely tomorrow. Low utility today as patient is presently 33 degrees, on sedation, chemically paralyzed.  -Emotional support provided. Chaplain now present at bedside and spiritual support appreciated   CRITICAL CARE Performed by: Cristal Generous   Additional critical care time: 20 minutes   Charles Gum MSN, AGACNP-BC [place] [phone] If no answer, [phone] 11/25/2018, 11:40 AM

## 2018-11-25 NOTE — Progress Notes (Signed)
Referred by nurse to see Mr. Jeansonne.  Spoke with daughter and nurse.  Daughter is worried about brother coming from out of town being able to see their father due to hospital restrictions.    I spoke with nurse about what could possibly be done to allow son to see him.

## 2018-11-25 NOTE — Progress Notes (Signed)
Pt with low SpO2 of 83% on 50% FiO2 and +5. Pt with bilateral breath sounds, small amount suctioned from ETT, ETT placement remains at 28cm at the lip. Pt taken off of ventilator and bagged with peep valve increased to 14. SpO2 increased from 83% to 93%. Pt placed back on ventilator. Moderate amount of secretions suctioned from pts mouth. FiO2 and peep able to be weaned to70% and +12. Spo2 is 97%. Family and RN at bedside. RT will continue to monitor.

## 2018-11-26 ENCOUNTER — Inpatient Hospital Stay (HOSPITAL_COMMUNITY): Payer: Medicare HMO

## 2018-11-26 DIAGNOSIS — J9601 Acute respiratory failure with hypoxia: Secondary | ICD-10-CM

## 2018-11-26 DIAGNOSIS — R0902 Hypoxemia: Secondary | ICD-10-CM

## 2018-11-26 LAB — COMPREHENSIVE METABOLIC PANEL
ALT: 80 U/L — ABNORMAL HIGH (ref 0–44)
AST: 148 U/L — ABNORMAL HIGH (ref 15–41)
Albumin: 1.5 g/dL — ABNORMAL LOW (ref 3.5–5.0)
Alkaline Phosphatase: 31 U/L — ABNORMAL LOW (ref 38–126)
Anion gap: 9 (ref 5–15)
BUN: 20 mg/dL (ref 8–23)
CO2: 20 mmol/L — ABNORMAL LOW (ref 22–32)
Calcium: 6.3 mg/dL — CL (ref 8.9–10.3)
Chloride: 120 mmol/L — ABNORMAL HIGH (ref 98–111)
Creatinine, Ser: 2.74 mg/dL — ABNORMAL HIGH (ref 0.61–1.24)
GFR calc Af Amer: 27 mL/min — ABNORMAL LOW (ref >60)
GFR calc non Af Amer: 23 mL/min — ABNORMAL LOW (ref >60)
Glucose, Bld: 138 mg/dL — ABNORMAL HIGH (ref 70–99)
Potassium: 2.6 mmol/L — CL (ref 3.5–5.1)
Sodium: 149 mmol/L — ABNORMAL HIGH (ref 135–145)
Total Bilirubin: 0.4 mg/dL (ref 0.3–1.2)
Total Protein: 3.2 g/dL — ABNORMAL LOW (ref 6.5–8.1)

## 2018-11-26 LAB — CBC
HCT: 38.3 % — ABNORMAL LOW (ref 39.0–52.0)
Hemoglobin: 12.9 g/dL — ABNORMAL LOW (ref 13.0–17.0)
MCH: 33.8 pg (ref 26.0–34.0)
MCHC: 33.7 g/dL (ref 30.0–36.0)
MCV: 100.3 fL — ABNORMAL HIGH (ref 80.0–100.0)
Platelets: 143 10*3/uL — ABNORMAL LOW (ref 150–400)
RBC: 3.82 MIL/uL — ABNORMAL LOW (ref 4.22–5.81)
RDW: 13 % (ref 11.5–15.5)
WBC: 4 10*3/uL (ref 4.0–10.5)
nRBC: 0.5 % — ABNORMAL HIGH (ref 0.0–0.2)

## 2018-11-26 LAB — MAGNESIUM: Magnesium: 0.8 mg/dL — CL (ref 1.7–2.4)

## 2018-11-26 LAB — BASIC METABOLIC PANEL
Anion gap: 12 (ref 5–15)
BUN: 22 mg/dL (ref 8–23)
CO2: 20 mmol/L — ABNORMAL LOW (ref 22–32)
Calcium: 6.2 mg/dL — CL (ref 8.9–10.3)
Chloride: 117 mmol/L — ABNORMAL HIGH (ref 98–111)
Creatinine, Ser: 2.64 mg/dL — ABNORMAL HIGH (ref 0.61–1.24)
GFR calc Af Amer: 28 mL/min — ABNORMAL LOW (ref >60)
GFR calc non Af Amer: 24 mL/min — ABNORMAL LOW (ref >60)
Glucose, Bld: 156 mg/dL — ABNORMAL HIGH (ref 70–99)
Potassium: 2.6 mmol/L — CL (ref 3.5–5.1)
Sodium: 149 mmol/L — ABNORMAL HIGH (ref 135–145)

## 2018-11-26 LAB — PHOSPHORUS: Phosphorus: 1.9 mg/dL — ABNORMAL LOW (ref 2.5–4.6)

## 2018-11-26 LAB — GLUCOSE, CAPILLARY
Glucose-Capillary: 135 mg/dL — ABNORMAL HIGH (ref 70–99)
Glucose-Capillary: 148 mg/dL — ABNORMAL HIGH (ref 70–99)
Glucose-Capillary: 115 mg/dL — ABNORMAL HIGH (ref 70–99)

## 2018-11-26 LAB — HEPARIN LEVEL (UNFRACTIONATED): Heparin Unfractionated: 0.43 IU/mL (ref 0.30–0.70)

## 2018-11-26 MED ORDER — MIDAZOLAM 50MG/50ML (1MG/ML) PREMIX INFUSION
0.0000 mg/h | INTRAVENOUS | Status: DC
  Administered 2018-11-26: 1 mg/h via INTRAVENOUS

## 2018-11-26 MED ORDER — SODIUM CHLORIDE 0.9 % IV SOLN
1.0000 g | Freq: Once | INTRAVENOUS | Status: AC
  Administered 2018-11-26: 1 g via INTRAVENOUS
  Filled 2018-11-26: qty 10

## 2018-11-26 MED ORDER — DIPHENHYDRAMINE HCL 50 MG/ML IJ SOLN: 25.0000 mg | INTRAMUSCULAR | Status: DC | PRN

## 2018-11-26 MED ORDER — POTASSIUM CHLORIDE 10 MEQ/50ML IV SOLN
10.0000 meq | INTRAVENOUS | Status: AC
  Administered 2018-11-26 (×4): 10 meq via INTRAVENOUS
  Filled 2018-11-26 (×4): qty 50

## 2018-11-26 MED ORDER — MORPHINE 100MG IN NS 100ML (1MG/ML) PREMIX INFUSION
0.0000 mg/h | INTRAVENOUS | Status: DC
  Administered 2018-11-26: 5 mg/h via INTRAVENOUS
  Filled 2018-11-26 (×2): qty 100

## 2018-11-26 MED ORDER — MORPHINE SULFATE (PF) 2 MG/ML IV SOLN: 2.0000 mg | INTRAVENOUS | Status: DC | PRN

## 2018-11-26 MED ORDER — GLYCOPYRROLATE 0.2 MG/ML IJ SOLN: 0.2000 mg | INTRAMUSCULAR | Status: DC | PRN

## 2018-11-26 MED ORDER — PHENYLEPHRINE HCL-NACL 40-0.9 MG/250ML-% IV SOLN
0.0000 ug/min | INTRAVENOUS | Status: DC
  Administered 2018-11-26: 300 ug/min via INTRAVENOUS
  Administered 2018-11-26: 360 ug/min via INTRAVENOUS
  Filled 2018-11-26 (×4): qty 250

## 2018-11-26 MED ORDER — MAGNESIUM SULFATE IN D5W 1-5 GM/100ML-% IV SOLN
1.0000 g | Freq: Once | INTRAVENOUS | Status: DC
  Administered 2018-11-26: 1 g via INTRAVENOUS
  Filled 2018-11-26: qty 100

## 2018-11-26 MED ORDER — ACETAMINOPHEN 325 MG PO TABS: 650.0000 mg | ORAL_TABLET | Freq: Four times a day (QID) | ORAL | Status: DC | PRN

## 2018-11-26 MED ORDER — DEXTROSE 5 % IV SOLN: INTRAVENOUS | Status: DC

## 2018-11-26 MED ORDER — MAGNESIUM SULFATE 50 % IJ SOLN
3.0000 g | Freq: Once | INTRAVENOUS | Status: AC
  Administered 2018-11-26: 3 g via INTRAVENOUS
  Filled 2018-11-26: qty 6

## 2018-11-26 MED ORDER — GLYCOPYRROLATE 1 MG PO TABS
1.0000 mg | ORAL_TABLET | ORAL | Status: DC | PRN
  Filled 2018-11-26: qty 1

## 2018-11-26 MED ORDER — ACETAMINOPHEN 650 MG RE SUPP: 650.0000 mg | Freq: Four times a day (QID) | RECTAL | Status: DC | PRN

## 2018-11-26 MED ORDER — MIDAZOLAM BOLUS VIA INFUSION (WITHDRAWAL LIFE SUSTAINING TX)
2.0000 mg | INTRAVENOUS | Status: DC | PRN
  Filled 2018-11-26: qty 2

## 2018-11-26 MED ORDER — MIDAZOLAM HCL 2 MG/2ML IJ SOLN: 1.0000 mg | INTRAMUSCULAR | Status: DC | PRN

## 2018-11-26 MED ORDER — POLYVINYL ALCOHOL 1.4 % OP SOLN
1.0000 [drp] | Freq: Four times a day (QID) | OPHTHALMIC | Status: DC | PRN
  Filled 2018-11-26: qty 15

## 2018-11-26 MED ORDER — POTASSIUM PHOSPHATES 15 MMOLE/5ML IV SOLN
30.0000 mmol | Freq: Once | INTRAVENOUS | Status: DC
  Administered 2018-11-26: 30 mmol via INTRAVENOUS
  Filled 2018-11-26: qty 10

## 2018-11-26 MED ORDER — MAGNESIUM SULFATE 2 GM/50ML IV SOLN: 2.0000 g | Freq: Once | INTRAVENOUS | Status: DC

## 2018-11-26 MED ORDER — MORPHINE BOLUS VIA INFUSION
5.0000 mg | INTRAVENOUS | Status: DC | PRN
  Filled 2018-11-26: qty 5

## 2018-11-26 MED ORDER — POTASSIUM CHLORIDE 10 MEQ/50ML IV SOLN
10.0000 meq | INTRAVENOUS | Status: DC
  Administered 2018-11-26: 10 meq via INTRAVENOUS
  Filled 2018-11-26: qty 50

## 2018-11-26 MED FILL — Medication: Qty: 1 | Status: AC

## 2018-11-26 DEATH — deceased

## 2018-11-27 ENCOUNTER — Encounter (HOSPITAL_COMMUNITY): Payer: Self-pay

## 2018-11-27 LAB — CALCIUM, IONIZED: Calcium, Ionized, Serum: 3.8 mg/dL — ABNORMAL LOW (ref 4.5–5.6)

## 2018-11-29 LAB — CULTURE, BLOOD (ROUTINE X 2)
Culture: NO GROWTH
Culture: NO GROWTH

## 2018-12-26 NOTE — Progress Notes (Signed)
CRITICAL VALUE ALERT  Critical Value:  Potassium 2.6, Ca 6.2  Date & Time Notied:  12/22/2018 2:23 AM  Provider Notified: [name], MD  Orders Received/Actions taken: Awaiting orders

## 2018-12-26 NOTE — Progress Notes (Signed)
Provided prayer and pastoral presence to Charles Reid wife, daughter, and sons in his move to comfort care and final transition.

## 2018-12-26 NOTE — Progress Notes (Signed)
Chaplain paged for family support in withdraw of care. Chaplain visited family to assess spiritual needs. At the time chaplain agreed to allow family time to be close to pt. Nurse will page chaplain when needed medications arrive to the unit.   Chaplain Resident, Evelene Croon, M. Div. Pager # [phone]

## 2018-12-26 NOTE — Progress Notes (Addendum)
Patient transitioned to comfort care. Morphine and versed gtts started prior to extubation. Patient extubated at 1136 per orders. Pressors weaned off. Patient appears comfortable. Chaplain at bedside to provide support for patient's family.  Patient expired at 1153. Declared by this RN and Lindwood Coke, RN. Mannam, MD notified as well as CDS.

## 2018-12-26 NOTE — ED Notes (Signed)
Attempted to call wife Re wallet.  Left message.

## 2018-12-26 NOTE — Progress Notes (Addendum)
PCCM Brief Interval Note  Family Meeting Goals of Care Date of Service 27-Nov-2018  Patient History: The patient is a 68 yo M who presents s/p cardiac arrest 8/30, downtime 60 minutes, who underwent TTM 8/30.  8/31 ECHO concerning for PE. Patient sent for CT Head to rule out ICH prior to initiating heparin. CT H critically significant for diffuse cerebral edema, brainstem herniation and is consistent with findings for anoxic injury. Patient with On 8/31 as rewarming was beginning, patient sustained subsequent cardiac arrest, with ROSC after >10 minutes. Patient remained critically ill, on high doses of 4 pressors, bicarb gtt, and with worsening oxygenation.  9/1 Family requested to meet to discuss goals of care upon arrival of final son.   I arrived at the bedside for planned family meeting November 27, 2018 approximately 9:55 AM. The patient's wife, adult daughter and two adult sons are present at the bedside to discuss goals of care for the patient. I provided a brief summary of the patient's clinical course. We discussed the patient's refractory shock despite maximal hemodynamic support. We discussed the presence of possible PE. We discussed the patients worsening pulmonary status despite increased ventilatory support. Most significantly, we discussed the findings of the patient's CT Head 8/31 and neurologic injury. I answered the son, Jidenna's, questions regarding EEG findings, performed during TTM, which showed diffuse encephalopathy and is read as diffuse hypoxic/anoxic injury vs. Effect of medications. At time of family meeting, patient has been off all sedation since approximately 6pm 8/31.  I demonstrated components of a neurologic exam at family request, with family observing lack of response to pain, lack of pupillary response and absence of blinking to threat.   After demonstration of these aspects of neurologic exam, patient's family states "Oh he isn't in there now." The family thanked me for going  through CT Head results in detail and for performing this exam. The family states that they would like to proceed with comfort oriented care. I ask the family if they would like information regarding comfort focussed care, to which all parties agree. I explained the goals of comfort care: to focus of treating symptoms of pain, anxiety, secretion burden, dyspnea, and to cease interventions aimed at curative care including but not limited to pressors, electrolyte correction, ventilatory support, antibiotics. Family voices understanding of these goals. I explained that when appropriate analgesia and anxiolytic agents are present, we will proceed with compassionate extubation, and discontinuation of other interventions. Family endorses understanding of this and is decided if they would like to be present or absent for physical extubation process.   P -DNR in place -Transition to comfort care -Morphine, versed, Rubinol -Compassionate liberation from ventilator -Discontinuation of interventions not aimed at comfort  -Chaplain for spiritual support       Time spent in coordination of goals of care: 40 minutes    Eliseo Gum MSN, AGACNP-BC [place] [phone] If no answer, [phone] November 27, 2018, 10:57 AM

## 2018-12-26 NOTE — Progress Notes (Signed)
Followed up with Mr. Charles Reid family from previous chaplain's notes and referral from nurse.  Prayed with wife, daughter, and son this morning.     Will continue to follow-up throughout the day.  Will check back with wife to ensure she is eating because Mr. Sorlie son and daughter noted that she had not been eating while there.

## 2018-12-26 NOTE — Progress Notes (Signed)
PULMONARY / CRITICAL CARE MEDICINE   NAME:  Charles Reid, MRN:  [id number], DOB:  July 21, 1950, LOS: 2 ADMISSION DATE:  June 07, 2018, CONSULTATION DATE:  8/30  REFERRING MD:  EDP Tegeler MD  , CHIEF COMPLAINT:  Cardiac Arrest   BRIEF HISTORY:    68 yo male with DM, HTN with witnessed arrest at home with immediate bystander CPR . CPR x 1 hr w/ ROSC . On arrival to ER , elevated troponin and EKG changes, taken to cath lab with non ischemic cardiomyopathy , EF 35-45% . PCCM to admit. TTM started.   HISTORY OF PRESENT ILLNESS   68 year old male minimum smoking history with no medical history of hypertension, hyperlipidemia and diabetes with witnessed cardiac arrest at home with immediate bystander CPR.  Per wife patient was watching online church this morning in usual state of good health.  Wife said she heard a grunting or snoring type sound collapsed while sitting.  Patient's wife is a respiratory therapist.  She activated EMS and started immediate CPR as he was pulseless.  On EMS arrival patient was reported to be in PEA.  CPR was continued.  During CPR patient did have V. fib requiring shock.  He had multiple rounds of CPR lasting around 1 hour total. In the emergency room patient was started on an epi drip and required Levophed for hypotension.  Chest x-ray was essentially clear.  Initial troponin very high at 173.  . lactic acid was elevated at 9.9.  Potassium 3.1.  WBC 14.9 EKG consistent with ischemia.  Patient was taken to the cardiac catheterization found to have a nonischemic cardiomyopathy with clean coronary arteries and mild to moderate LV dysfunction EF 35-45%.  Concerned that his arrest  was most likely arrhythmogenic in nature.   TTM (Ice packs ) started in cath lab . On arrival to ICU , patient unresponsive on no sedation . Appears comfortable on vent . Temp on arrival 34 degrees C . TTM pads started.  B/p improved and weaned off pressors .   SIGNIFICANT PAST MEDICAL HISTORY   Hypertension,  hyperlipidemia, diabetes  SIGNIFICANT EVENTS:  8/30-cardiac arrest, CPR x1 hour with ROSC, taken to the cardiac Cath Lab, TTM started in cath lab  8/31 ECHO concerning for PE. CT head ordered. EEG pending. Weaning pressors. Remains cool but is due to initiation of rewarm at 1500.  Cardiac arrest at 1800 with ROSC after approximately 10 minutes. Patient on high doses of 4 pressors, made DNR  9/1 Remains critically ill. Has been off all sedation. Unasyn discontinued   STUDIES:   8/30 cardiac cath> nonischemic cardiomyopathy, normal coronaries, EF 35 to 45% 8/30 ECHO: LVEF 50-55%, LV hypokinesis without wall motion abnormalities. RV free wall hypokinesis possible McConnells sign 8/31 CXR> bibasilar opacities (suspect atelectasis vs infiltrate), stable cardiomegaly  8/31 EEG>diffuse encephalopathy concerning for anoxic injury 8/31 CT Head> Extensive supratentorial edema, infratentorial edema. Loss of cortical differentiation consistent with diffuse anoxic injury. Cerebral edema effaces sulci, basal cisterns, fourth ventricle with downward herniation of brainstem and cerebellum into foramen magnum. CXR 9/1>>> pending >>>  CULTURES:  8/30 SARS CoV2 >Neg  8/30 BC x 2 > no growth 2 days  8/31 Trachea aspirate >>  ANTIBIOTICS:  Unasyn 8/31  LINES/TUBES:  8/30 ETT >> 8/30 CVC >>   CONSULTANTS:  Cardiology  (signed off)   INTERVAL/SUBJECTIVE:  Remains critically ill, remains intubated. Increased vent settings overnight with worsening oxygenation Nearly max on 4 pressors Has been off all sedation/paralytics since yesterday afternoon  CONSTITUTIONAL: BP 110/68   Pulse (!) 103   Temp (!) 96.8 F (36 C) (Bladder)   Resp (!) 24   Ht 6' (1.829 m)   Wt (!) 136.9 kg   SpO2 91%   BMI 40.93 kg/m   I/O last 3 completed shifts: In: 15640.7 [I.V.:12391.6; NG/GT:76.3; IV Piggyback:3172.8] Out: 4888 [Urine:4888]  CVP:  [5 mmHg-13 mmHg] 13 mmHg  Vent Mode: PRVC FiO2 (%):  [50 %-100 %] 100  % Set Rate:  [16 bmp-24 bmp] 24 bmp Vt Set:  [670 mL] 670 mL PEEP:  [5 cmH20-12 cmH20] 12 cmH20 Plateau Pressure:  [18 MOQ94-76 cmH20] 22 cmH20  PHYSICAL EXAM:  General: Critically ill appearing adult M, intubated, off all sedation, NAD  Neuro: Off all sedation. Pupils 22mm fixed. Does not respond to pain. Does not blink to threat  HEENT: Periorbital edema. ETT secure, trachea midline, pink mmm Cardiovascular: RRR s1s2 no rgm. Cap refill sluggish  Lungs: Course breath sounds, diminished bibasilar sounds. Symmetrical chest expansion.  Abdomen: round, mildly distended. Hypoactive x4  Musculoskeletal: Anasarca. No obvious joint deformity. Symmetrical muscle bulk and tone  Skin: clean dry, without rash   RESOLVED PROBLEM LIST   ASSESSMENT AND PLAN    Acute hypoxic respiratory failure in setting of cardiac arrest -ETT 8/30 -Worsening hypoxia 8/31, 9/1 -CXR 9/1 read pending, appearance of worsening diffuse edema on CXR  P -Continue mechanical ventilation -Vent settings have increased since cardiac arrest yesterday  -Adjust PEEP/FiO2 for goal SpO2 > 88%.  -Pulm hygiene -AM CXR -ABG now  -Despite worsening edema, cannot diurese due to refractory shock   Acute encephalopathy Concern for Anoxic Injury in setting of cardiac arrest -in setting of prolonged CPR, concern for anoxic injury due to total downtime -difficult to assess at this time due to sedation, paralytics CT Head and EEG as above P -Devastating neurologic injury as suggested on CT H (8/31)  -Patient has been off all sedation since 8/31 6pm -If family is to continue aggressive care, will have neuro consult -Neurologic outcome is likely very poor and will   Cardiac arrest, several  -Presented s/p several subsequent arrests. Total CPR > 1 hr with multiple cycles of arrest/ROSC. Initial PEA arrest, subsequently Vfib arrest. -TTM initiated 8/30, re-warmining initiated 8/31 -8/31 Subsequent cardiac arrest (Vt, VF) with ROSC  after approx 10 min Circulatory shock, refractory  Concern for PE -s/p L cath 8/30 showing 35-40% LVEF.  -ECHO LVEF 50-55%, LV hypokinesis without wall motion abnormalities. RV free wall hypokinesis possible McConnells sign -CVP has been low, has received multiple fluid boluses  P -Rewarming initiated 9/31 -Off all sedation, off cis  -MAP goal >65; at present is on NE, Epi, Vaso, neo at maximum and near-max doses  -CVP q4hr  -heparin per pharmacy although do not feel this is adding great benefit in overall clinical picture   DM -insulin gtt  AKI -likely 2/2 hypoperfusion in setting if cardiac arrest  -interval worsening Cr from 1.8 to 2.42 (8/31) AGMA P -Strict I/O -Continue bicarb gtt. Increasing to 143ml/hr (8/31) based on last ABG  -Trend renal function via metabolic panels  -MAP goal > 65 as above for renal perfusion  Transaminitis, improving -likely in setting of end organ damage related to shock, cardiac arrest  P -Trend LFTs  -Supportive care as above  Leukocytosis, improved  -reactive vs early infective, aspiration P -dc unasyn  -can resume if worsening leukocytosis   Hypomagnesemia Hypokalemia Hypocalcemia Hypophosphatemia P -Replacing Ca, Mag, K, Phos  -Trend  lytes and replace as needed   Malnutrition, at risk P -Trickle feeds started yesterday--these have been held since cardiac arrest yesterday afternoon. I have discontinued the order today as patient clinically is worsening and nearly max on 4 pressors. We can revisit EN if able to achieve some progress with shock. Given overall clinical scenario, EN at this time is not of great benefit and is okay to be deferred.   Goals of Care: -Wife and daughter desire for transition to comfort care -Two sons would like to meet again this morning for further discussion P Will meet with family upon son arrival  Neurologic outcome is very likely devastating in setting of diffuse anoxic injury, cerebral edema,  brainstem herniation   SUMMARY OF TODAY'S PLAN:  -Continue present care -Family meeting to discuss goals of care   Best Practice / Goals of Care / Disposition.   DVT PROPHYLAXIS: SQ Heparin SUP:PPI  NUTRITION: NPO  MOBILITY:BR  GOALS OF CARE: Full Code  FAMILY DISCUSSIONS: Wife updated at bedside  DISPOSITION ICU   LABS  Glucose Recent Labs  Lab 11/25/18 1603 11/25/18 1719 11/25/18 1834 11/25/18 2000 12/17/2018 0128 12/25/2018 0434  GLUCAP 202* 208* 205* 172* 135* 148*    BMET Recent Labs  Lab 11/25/18 1958 11/27/2018 0134 12/24/2018 0416  NA 148* 149* 149*  K 2.5* 2.6* 2.6*  CL 114* 117* 120*  CO2 19* 20* 20*  BUN 23 22 20   CREATININE 2.84* 2.64* 2.74*  GLUCOSE 199* 156* 138*    Liver Enzymes Recent Labs  Lab 11/25/18 1650 11/25/18 1807 12/14/2018 0416  AST 229* 276* 148*  ALT 106* 131* 80*  ALKPHOS 41 47 31*  BILITOT 0.3 0.2* 0.4  ALBUMIN 2.0* 2.3* 1.5*    Electrolytes Recent Labs  Lab 11/25/18 0431  11/25/18 1650 11/25/18 1807 11/25/18 1958 11/27/2018 0134 12/20/2018 0416  CALCIUM 7.7*   < > 6.8* 6.9* 7.7* 6.2* 6.3*  MG 1.6*  --  1.2* 1.2*  --   --  0.8*  PHOS 2.6  --   --  <1.0*  --   --  1.9*   < > = values in this interval not displayed.    CBC Recent Labs  Lab January 04, 2019 1543  11/25/18 0431  11/25/18 1653 11/25/18 1722 12/06/2018 0416  WBC 17.4*  --  16.3*  --   --   --  4.0  HGB 14.3   < > 14.9   < > 12.2* 13.6 12.9*  HCT 42.4   < > 46.2   < > 36.0* 40.0 38.3*  PLT 193  --  217  --   --   --  143*   < > = values in this interval not displayed.    ABG Recent Labs  Lab 11/25/18 0848 11/25/18 1611 11/25/18 1722  PHART 7.270* 7.286* 7.334*  PCO2ART 34.1 43.3 36.0  PO2ART 72.0* 38.0* 60.0*    Coag's Recent Labs  Lab January 04, 2019 1543 January 04, 2019 2323 11/25/18 0431  APTT 35 34  --   INR 1.2 1.3* 1.1    Sepsis Markers Recent Labs  Lab 11/25/18 0431 11/25/18 1200 11/25/18 1325  LATICACIDVEN 8.3* 4.0* 4.2*    Cardiac  Enzymes No results for input(s): TROPONINI, PROBNP in the last 168 hours.     CRITICAL CARE  Total critical care time: 60 minutes  Critical care time was exclusive of separately billable procedures and treating other patients.  Critical care was necessary to treat or prevent imminent or life-threatening deterioration.  Critical care was time spent personally by me on the following activities: development of treatment plan with patient and/or surrogate as well as nursing, discussions with consultants, evaluation of patient's response to treatment, examination of patient, obtaining history from patient or surrogate, ordering and performing treatments and interventions, ordering and review of laboratory studies, ordering and review of radiographic studies, pulse oximetry and re-evaluation of patient's condition.  Tessie Fass MSN, AGACNP-BC [facility] [phone] If no answer, [phone] 12/19/2018, 7:31 AM

## 2018-12-26 NOTE — Progress Notes (Signed)
225 mL fentanyl gtt wasted in stericycle with Lindwood Coke, RN as witness.

## 2018-12-26 NOTE — Discharge Summary (Addendum)
Physician Death Summary  Patient ID: Charles Reid MRN: [id number] DOB/AGE: Jan 09, 1951 68 y.o.  Admit date: 10/31/2018 Discharge date: 12-23-2018  Admission Diagnoses: Cardiac arrest  Discharge Diagnoses:  Severe anoxic brain injury Acute respiratory failure Cardiac arrest Acute kidney injury  Discharged Condition: Deceased  Hospital Course:  68 yo male with DM, HTN with witnessed arrest at home with immediate bystander CPR . CPR x 1 hr w/ ROSC . On arrival to ER , elevated troponin and EKG changes, taken to cath lab with findings of normal coronaries, nonischemic cardiomyopathy, EF 35-45% .  He was started on hypothermia protocol and on empiric heparin for treatment of pulmonary embolism given findings of McConnell's sign on echocardiogram.  CT angiogram was not obtained due to elevated creatinine and acute kidney injury  Patient had a CT head which demonstrated severe anoxic brain injury with extensive cerebral edema, effacement of cortical differentiation and downward herniation of brainstem and cerebellum into the foramen magnum.  He had another cardiac arrest on rewarming on the evening of 11/25/2018.  Off sedation his neurologic status continued to be poor with loss of pupillary and brainstem reflexes.  He was hemodynamically unstable and maintained on maximum pressor support including epinephrine drip  We had extensive discussions with wife and his 3 children at bedside.  Given his poor prognosis for neurologic recovery they did not want to prolong his suffering and requested transition to comfort care.  He was terminally extubated and passed away at 11:53 AM.  Signed: Dulcemaria Bula 12/23/2018, 12:10 PM

## 2018-12-26 NOTE — Procedures (Signed)
Extubation Procedure Note  Patient Details:   Name: Charles Reid DOB: 12/13/1950 MRN: [id number]   Airway Documentation:    Vent end date: December 05, 2018 Vent end time: 1136   Evaluation  O2 sats: currently acceptable Complications: No apparent complications Patient did tolerate procedure well. Bilateral Breath Sounds: Clear, Diminished   Pt extubated to comfort care per physician order and family request. Pt suctioned via ETT and orally prior. Family and RN at bedside.   Sharla Kidney 12/05/2018, 11:37 AM

## 2018-12-26 NOTE — Progress Notes (Signed)
Pt noted to have cuff leak. Air added to cuff for MOV. Delivered and return volumes remain adequate on ventilator at this time. GBowser NP notified of findings. No new orders at this time. RT will continue to monitor.

## 2018-12-26 NOTE — Progress Notes (Signed)
95 mL morphine and 40 mL versed wasted in stericycle with Bo Merino, RN as witness.

## 2018-12-26 NOTE — Progress Notes (Signed)
Nutrition Brief Note  Chart reviewed. Pt now transitioning to comfort care.  No further nutrition interventions warranted at this time.  Please re-consult as needed.   Claryssa Sandner RD, LDN Clinical Nutrition Pager # - [phone]    

## 2018-12-26 DEATH — deceased

## 2019-01-26 NOTE — Progress Notes (Signed)
Entered chart for [employer] QI data 

## 2021-01-17 IMAGING — DX PORTABLE CHEST - 1 VIEW
1 series · 1 of 1 positions shown · non-contrast
Comparison: None.

CLINICAL DATA: Cardiac arrest

EXAM:
PORTABLE CHEST 1 VIEW

[view not recorded]
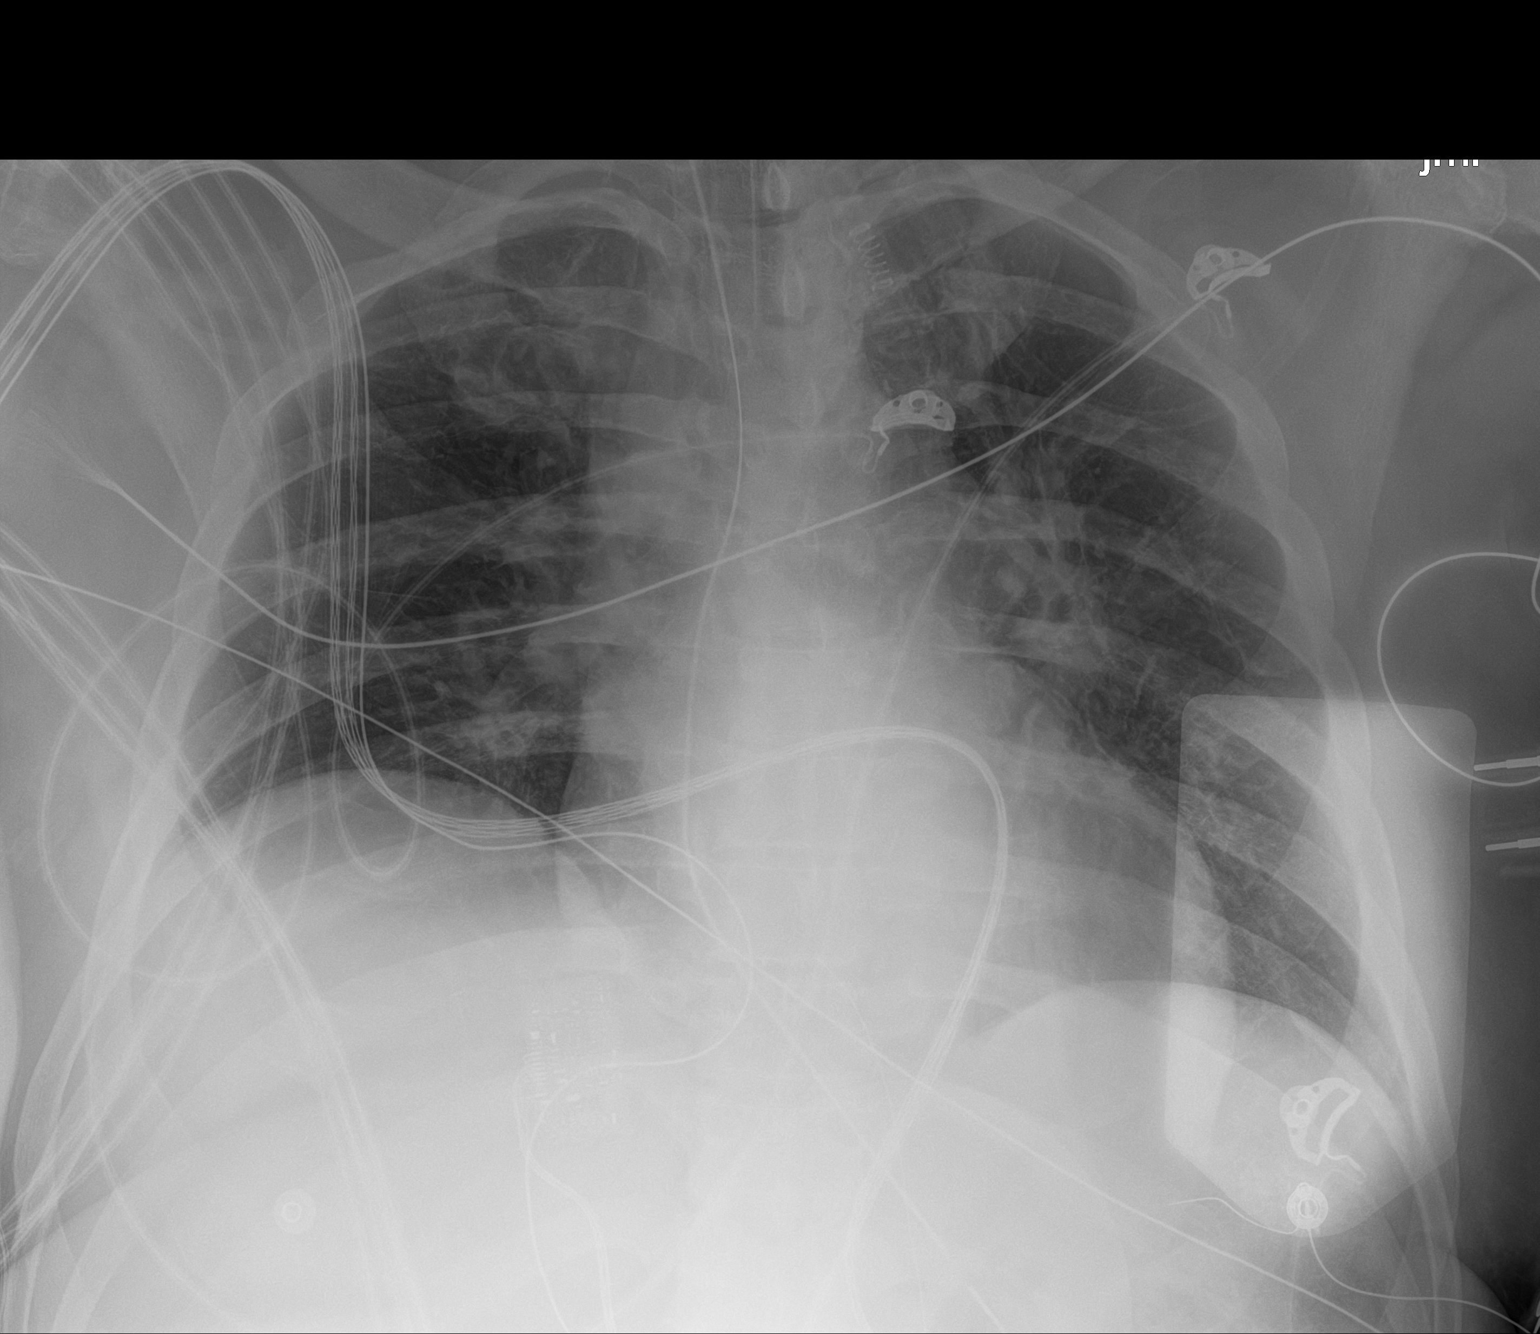

[1 of 1 positions shown; findings below may reference images not displayed]

FINDINGS: Endotracheal tube tip is 5.1 cm above the carina. Enteric tube
enters stomach with the tip not seen on this image. Pacer pad
overlies the lateral lower left chest. Normal cardiac and
mediastinal silhouette. No pneumothorax. No pleural effusion.
Slightly low lung volumes. No pulmonary edema. No acute
consolidative airspace disease.
IMPRESSION: Well-positioned support structures. Slightly low lung volumes with
no active cardiopulmonary disease.

## 2021-01-18 IMAGING — CR PORTABLE CHEST - 1 VIEW
1 series · 1 of 1 positions shown · non-contrast
Comparison: 11/24/2018.

CLINICAL DATA: Intubation.  Cardiac arrest.

EXAM:
PORTABLE CHEST 1 VIEW

[AP]
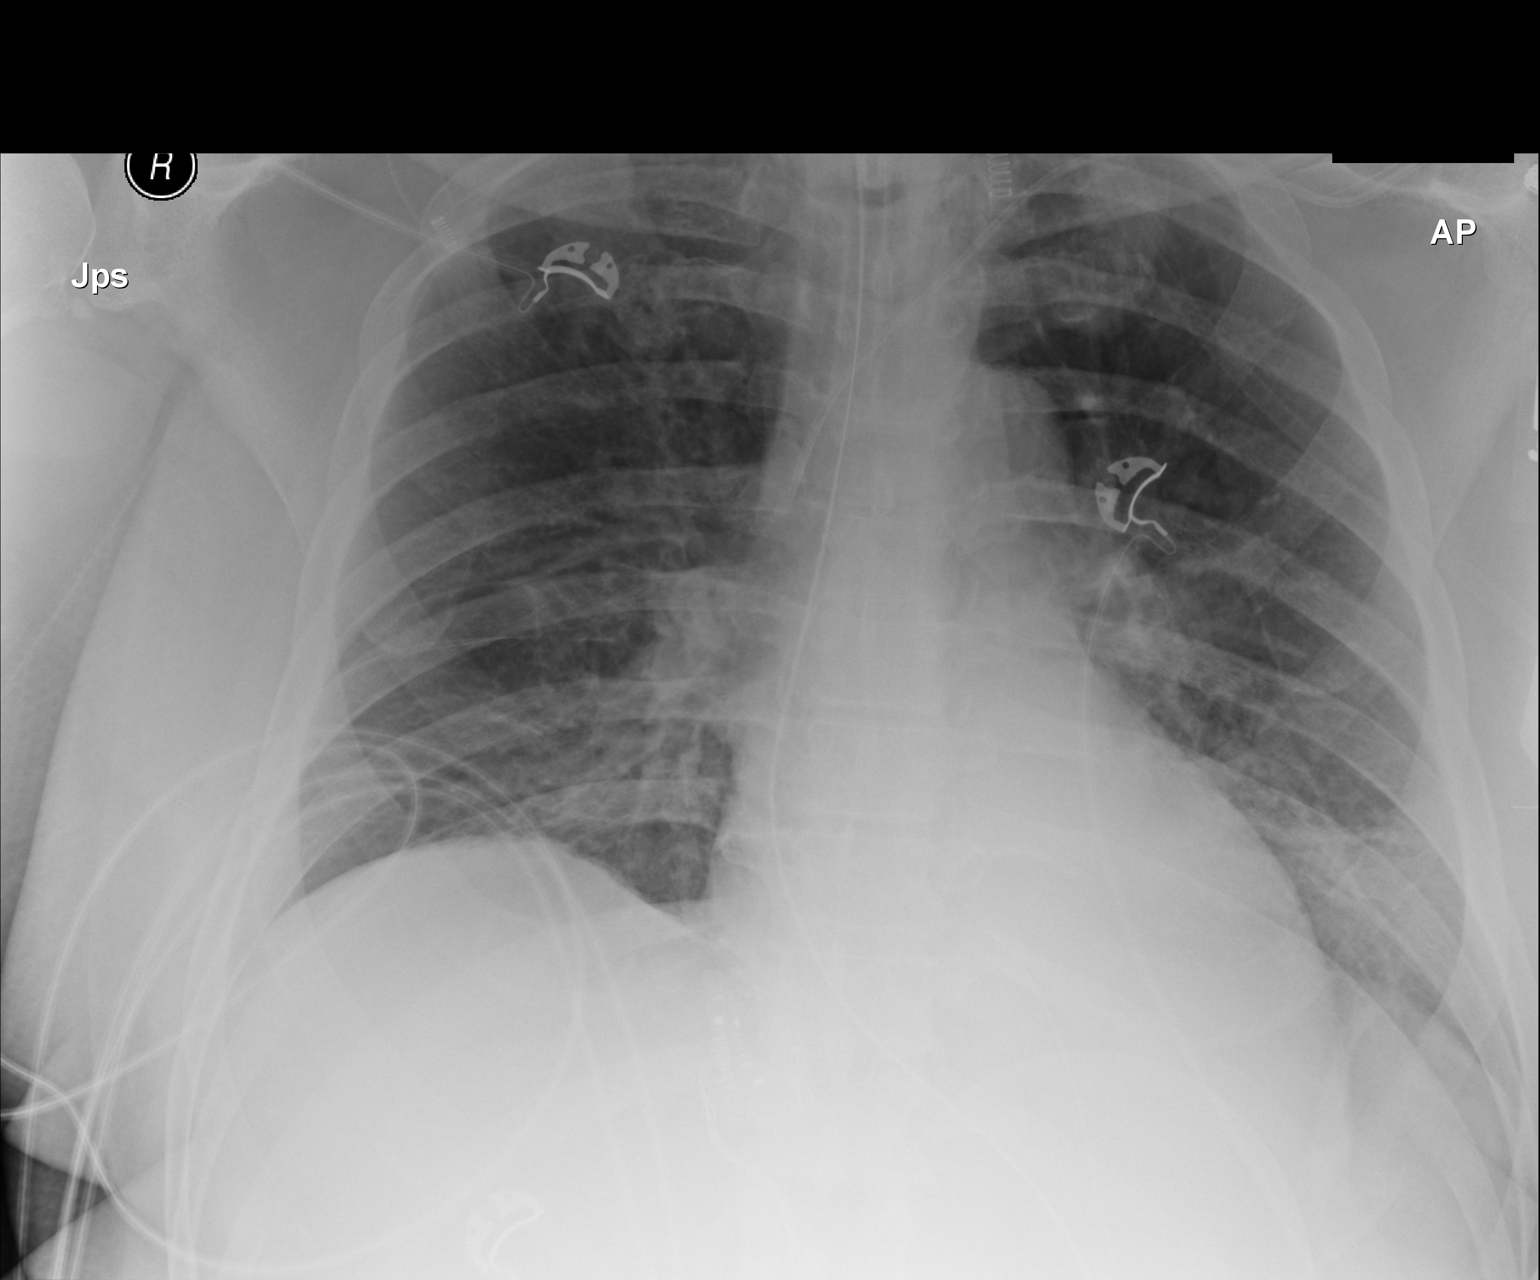

[1 of 1 positions shown; findings below may reference images not displayed]

FINDINGS: Endotracheal tube, NG tube, left subclavian line stable position.
Stable cardiomegaly. Mild bibasilar atelectasis/infiltrates,
slightly progressed from prior exam. No pleural effusion or
pneumothorax.
IMPRESSION: 1.  Lines and tubes stable position.

2.  Stable cardiomegaly.

3. Mild bibasilar atelectasis/infiltrates, slightly progressed from
prior exam.
